# Patient Record
Sex: Male | Born: 1955
Health system: Southern US, Community
[De-identification: ages and names within clinical notes are randomized; demographics above are authoritative.]

## PROBLEM LIST (undated history)

## (undated) DIAGNOSIS — N400 Enlarged prostate without lower urinary tract symptoms: Secondary | ICD-10-CM

## (undated) DIAGNOSIS — E785 Hyperlipidemia, unspecified: Secondary | ICD-10-CM

## (undated) DIAGNOSIS — M1990 Unspecified osteoarthritis, unspecified site: Secondary | ICD-10-CM

## (undated) DIAGNOSIS — I10 Essential (primary) hypertension: Secondary | ICD-10-CM

## (undated) DIAGNOSIS — N189 Chronic kidney disease, unspecified: Secondary | ICD-10-CM

## (undated) DIAGNOSIS — M109 Gout, unspecified: Secondary | ICD-10-CM

## (undated) HISTORY — PX: OTHER SURGICAL HISTORY: SHX169

## (undated) HISTORY — DX: Unspecified osteoarthritis, unspecified site: M19.90

## (undated) HISTORY — PX: TOOTH EXTRACTION: SUR596

## (undated) HISTORY — DX: Benign prostatic hyperplasia without lower urinary tract symptoms: N40.0

---
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
## 2013-11-07 ENCOUNTER — Other Ambulatory Visit (HOSPITAL_COMMUNITY): Payer: Self-pay | Admitting: Respiratory Therapy

## 2013-11-07 DIAGNOSIS — G4730 Sleep apnea, unspecified: Secondary | ICD-10-CM

## 2013-11-10 ENCOUNTER — Ambulatory Visit: Payer: 59 | Attending: Neurology | Admitting: Sleep Medicine

## 2013-11-10 VITALS — Ht 70.0 in | Wt 265.0 lb

## 2013-11-10 DIAGNOSIS — R0683 Snoring: Secondary | ICD-10-CM | POA: Diagnosis present

## 2013-11-10 DIAGNOSIS — G4733 Obstructive sleep apnea (adult) (pediatric): Secondary | ICD-10-CM | POA: Insufficient documentation

## 2013-11-10 DIAGNOSIS — R5383 Other fatigue: Secondary | ICD-10-CM | POA: Diagnosis present

## 2013-11-10 DIAGNOSIS — G4710 Hypersomnia, unspecified: Secondary | ICD-10-CM | POA: Diagnosis present

## 2013-11-10 DIAGNOSIS — G4730 Sleep apnea, unspecified: Secondary | ICD-10-CM

## 2013-11-10 DIAGNOSIS — Z6838 Body mass index (BMI) 38.0-38.9, adult: Secondary | ICD-10-CM | POA: Diagnosis not present

## 2013-11-16 NOTE — Sleep Study (Signed)
  [place] A. Merlene Laughter, MD     [email or web address]        NOCTURNAL HOME SLEEP STUDY   LOCATION: SLEEP LAB FACILITY: [facility]   PHYSICIAN: Merrill Villarruel A. Merlene Laughter, M.D.   DATE OF STUDY: 11/10/2013.   REFERRING PHYSICIAN: Karrigan Messamore.   INDICATIONS: The patient is 58 year old presents with fatigue, snoring and hypersomnia.  MEDICATIONS:  Prior to Admission medications   Not on File      EPWORTH SLEEPINESS SCALE: 12.   BMI: 38.   ANALYSIS:  Channels recorded the following: Airflow with a nasal pressure transducer, oxygen saturation, heart rate, thoracic and abdominal respiratory movements and body position. The AHI is 52. The average oxygen saturation is 92. The lowest oxygen saturation is 72. Average heart rate is 70.  IMPRESSION:  1. Severe obstructive sleep apnea syndrome. A formal CPAP titration recording is suggested.  Thanks for this referral.  Mahari Vankirk A. Merlene Laughter, M.D. Diplomat, Tax adviser of Sleep Medicine.

## 2014-07-17 ENCOUNTER — Other Ambulatory Visit (HOSPITAL_COMMUNITY): Payer: Self-pay | Admitting: Nephrology

## 2014-07-17 DIAGNOSIS — N183 Chronic kidney disease, stage 3 unspecified: Secondary | ICD-10-CM

## 2014-08-10 ENCOUNTER — Ambulatory Visit (HOSPITAL_COMMUNITY)
Admission: RE | Admit: 2014-08-10 | Discharge: 2014-08-10 | Disposition: A | Discharge status: Home / Self Care | Payer: 59 | Source: Ambulatory Visit | Attending: Nephrology | Admitting: Nephrology

## 2014-08-10 DIAGNOSIS — N183 Chronic kidney disease, stage 3 unspecified: Secondary | ICD-10-CM

## 2015-11-25 DIAGNOSIS — L309 Dermatitis, unspecified: Secondary | ICD-10-CM | POA: Diagnosis not present

## 2015-11-25 DIAGNOSIS — Z1389 Encounter for screening for other disorder: Secondary | ICD-10-CM | POA: Diagnosis not present

## 2015-11-25 DIAGNOSIS — M1000 Idiopathic gout, unspecified site: Secondary | ICD-10-CM | POA: Diagnosis not present

## 2015-11-25 DIAGNOSIS — Z6838 Body mass index (BMI) 38.0-38.9, adult: Secondary | ICD-10-CM | POA: Diagnosis not present

## 2015-11-25 DIAGNOSIS — N183 Chronic kidney disease, stage 3 (moderate): Secondary | ICD-10-CM | POA: Diagnosis not present

## 2015-11-27 DIAGNOSIS — N183 Chronic kidney disease, stage 3 (moderate): Secondary | ICD-10-CM | POA: Diagnosis not present

## 2015-11-27 DIAGNOSIS — M1000 Idiopathic gout, unspecified site: Secondary | ICD-10-CM | POA: Diagnosis not present

## 2015-11-27 DIAGNOSIS — Z1389 Encounter for screening for other disorder: Secondary | ICD-10-CM | POA: Diagnosis not present

## 2015-11-27 DIAGNOSIS — Z6839 Body mass index (BMI) 39.0-39.9, adult: Secondary | ICD-10-CM | POA: Diagnosis not present

## 2015-11-27 DIAGNOSIS — I10 Essential (primary) hypertension: Secondary | ICD-10-CM | POA: Diagnosis not present

## 2015-12-16 DIAGNOSIS — Z6838 Body mass index (BMI) 38.0-38.9, adult: Secondary | ICD-10-CM | POA: Diagnosis not present

## 2015-12-16 DIAGNOSIS — N183 Chronic kidney disease, stage 3 (moderate): Secondary | ICD-10-CM | POA: Diagnosis not present

## 2015-12-16 DIAGNOSIS — M1A00X0 Idiopathic chronic gout, unspecified site, without tophus (tophi): Secondary | ICD-10-CM | POA: Diagnosis not present

## 2015-12-16 DIAGNOSIS — E782 Mixed hyperlipidemia: Secondary | ICD-10-CM | POA: Diagnosis not present

## 2015-12-16 DIAGNOSIS — M7711 Lateral epicondylitis, right elbow: Secondary | ICD-10-CM | POA: Diagnosis not present

## 2015-12-17 ENCOUNTER — Ambulatory Visit (HOSPITAL_COMMUNITY)
Admission: RE | Admit: 2015-12-17 | Discharge: 2015-12-17 | Disposition: A | Discharge status: Home / Self Care | Payer: 59 | Source: Ambulatory Visit | Attending: Internal Medicine | Admitting: Internal Medicine

## 2015-12-17 ENCOUNTER — Other Ambulatory Visit (HOSPITAL_COMMUNITY): Payer: Self-pay | Admitting: Internal Medicine

## 2015-12-17 DIAGNOSIS — R809 Proteinuria, unspecified: Secondary | ICD-10-CM | POA: Diagnosis not present

## 2015-12-17 DIAGNOSIS — M25521 Pain in right elbow: Secondary | ICD-10-CM | POA: Insufficient documentation

## 2015-12-17 DIAGNOSIS — Z79899 Other long term (current) drug therapy: Secondary | ICD-10-CM | POA: Diagnosis not present

## 2015-12-17 DIAGNOSIS — E559 Vitamin D deficiency, unspecified: Secondary | ICD-10-CM | POA: Diagnosis not present

## 2015-12-17 DIAGNOSIS — N183 Chronic kidney disease, stage 3 (moderate): Secondary | ICD-10-CM | POA: Diagnosis not present

## 2015-12-17 DIAGNOSIS — I10 Essential (primary) hypertension: Secondary | ICD-10-CM | POA: Diagnosis not present

## 2015-12-17 DIAGNOSIS — D509 Iron deficiency anemia, unspecified: Secondary | ICD-10-CM | POA: Diagnosis not present

## 2015-12-17 DIAGNOSIS — R0602 Shortness of breath: Secondary | ICD-10-CM

## 2015-12-24 DIAGNOSIS — M109 Gout, unspecified: Secondary | ICD-10-CM | POA: Diagnosis not present

## 2015-12-24 DIAGNOSIS — M79672 Pain in left foot: Secondary | ICD-10-CM | POA: Diagnosis not present

## 2015-12-24 DIAGNOSIS — M25461 Effusion, right knee: Secondary | ICD-10-CM | POA: Diagnosis not present

## 2015-12-24 DIAGNOSIS — M79671 Pain in right foot: Secondary | ICD-10-CM | POA: Diagnosis not present

## 2016-01-27 DIAGNOSIS — M109 Gout, unspecified: Secondary | ICD-10-CM | POA: Diagnosis not present

## 2016-01-27 DIAGNOSIS — Z79899 Other long term (current) drug therapy: Secondary | ICD-10-CM | POA: Diagnosis not present

## 2016-01-28 DIAGNOSIS — Z79899 Other long term (current) drug therapy: Secondary | ICD-10-CM | POA: Diagnosis not present

## 2016-01-28 DIAGNOSIS — M25562 Pain in left knee: Secondary | ICD-10-CM | POA: Diagnosis not present

## 2016-01-28 DIAGNOSIS — M109 Gout, unspecified: Secondary | ICD-10-CM | POA: Diagnosis not present

## 2016-01-28 DIAGNOSIS — N183 Chronic kidney disease, stage 3 (moderate): Secondary | ICD-10-CM | POA: Diagnosis not present

## 2016-03-04 DIAGNOSIS — M109 Gout, unspecified: Secondary | ICD-10-CM | POA: Diagnosis not present

## 2016-03-04 DIAGNOSIS — Z79899 Other long term (current) drug therapy: Secondary | ICD-10-CM | POA: Diagnosis not present

## 2016-03-04 DIAGNOSIS — N183 Chronic kidney disease, stage 3 (moderate): Secondary | ICD-10-CM | POA: Diagnosis not present

## 2016-03-05 DIAGNOSIS — M25461 Effusion, right knee: Secondary | ICD-10-CM | POA: Diagnosis not present

## 2016-03-05 DIAGNOSIS — M109 Gout, unspecified: Secondary | ICD-10-CM | POA: Diagnosis not present

## 2016-03-05 DIAGNOSIS — N183 Chronic kidney disease, stage 3 (moderate): Secondary | ICD-10-CM | POA: Diagnosis not present

## 2016-03-05 DIAGNOSIS — R799 Abnormal finding of blood chemistry, unspecified: Secondary | ICD-10-CM | POA: Diagnosis not present

## 2016-04-30 DIAGNOSIS — M25532 Pain in left wrist: Secondary | ICD-10-CM | POA: Diagnosis not present

## 2016-04-30 DIAGNOSIS — M109 Gout, unspecified: Secondary | ICD-10-CM | POA: Diagnosis not present

## 2016-04-30 DIAGNOSIS — M25461 Effusion, right knee: Secondary | ICD-10-CM | POA: Diagnosis not present

## 2016-04-30 DIAGNOSIS — Z79899 Other long term (current) drug therapy: Secondary | ICD-10-CM | POA: Diagnosis not present

## 2016-05-26 DIAGNOSIS — Z79899 Other long term (current) drug therapy: Secondary | ICD-10-CM | POA: Diagnosis not present

## 2016-05-26 DIAGNOSIS — N183 Chronic kidney disease, stage 3 (moderate): Secondary | ICD-10-CM | POA: Diagnosis not present

## 2016-05-26 DIAGNOSIS — D509 Iron deficiency anemia, unspecified: Secondary | ICD-10-CM | POA: Diagnosis not present

## 2016-05-26 DIAGNOSIS — I10 Essential (primary) hypertension: Secondary | ICD-10-CM | POA: Diagnosis not present

## 2016-05-26 DIAGNOSIS — E559 Vitamin D deficiency, unspecified: Secondary | ICD-10-CM | POA: Diagnosis not present

## 2016-06-02 DIAGNOSIS — E870 Hyperosmolality and hypernatremia: Secondary | ICD-10-CM | POA: Diagnosis not present

## 2016-06-02 DIAGNOSIS — N183 Chronic kidney disease, stage 3 (moderate): Secondary | ICD-10-CM | POA: Diagnosis not present

## 2016-06-02 DIAGNOSIS — I10 Essential (primary) hypertension: Secondary | ICD-10-CM | POA: Diagnosis not present

## 2016-06-02 DIAGNOSIS — R809 Proteinuria, unspecified: Secondary | ICD-10-CM | POA: Diagnosis not present

## 2016-11-03 DIAGNOSIS — Z681 Body mass index (BMI) 19 or less, adult: Secondary | ICD-10-CM | POA: Diagnosis not present

## 2016-11-03 DIAGNOSIS — M25572 Pain in left ankle and joints of left foot: Secondary | ICD-10-CM | POA: Diagnosis not present

## 2016-11-03 DIAGNOSIS — M25562 Pain in left knee: Secondary | ICD-10-CM | POA: Diagnosis not present

## 2016-11-03 DIAGNOSIS — Z1389 Encounter for screening for other disorder: Secondary | ICD-10-CM | POA: Diagnosis not present

## 2016-11-03 DIAGNOSIS — M25532 Pain in left wrist: Secondary | ICD-10-CM | POA: Diagnosis not present

## 2016-11-03 DIAGNOSIS — M1000 Idiopathic gout, unspecified site: Secondary | ICD-10-CM | POA: Diagnosis not present

## 2016-11-17 ENCOUNTER — Observation Stay (HOSPITAL_COMMUNITY)
Admission: EM | Admit: 2016-11-17 | Discharge: 2016-11-18 | Disposition: A | Discharge status: Home / Self Care | Payer: BLUE CROSS/BLUE SHIELD | Attending: Internal Medicine | Admitting: Internal Medicine

## 2016-11-17 ENCOUNTER — Emergency Department (HOSPITAL_COMMUNITY): Payer: BLUE CROSS/BLUE SHIELD

## 2016-11-17 ENCOUNTER — Observation Stay (HOSPITAL_BASED_OUTPATIENT_CLINIC_OR_DEPARTMENT_OTHER): Payer: BLUE CROSS/BLUE SHIELD

## 2016-11-17 ENCOUNTER — Encounter (HOSPITAL_COMMUNITY): Payer: Self-pay | Admitting: Emergency Medicine

## 2016-11-17 DIAGNOSIS — I959 Hypotension, unspecified: Secondary | ICD-10-CM | POA: Insufficient documentation

## 2016-11-17 DIAGNOSIS — M10471 Other secondary gout, right ankle and foot: Secondary | ICD-10-CM | POA: Diagnosis not present

## 2016-11-17 DIAGNOSIS — R739 Hyperglycemia, unspecified: Secondary | ICD-10-CM | POA: Diagnosis not present

## 2016-11-17 DIAGNOSIS — N289 Disorder of kidney and ureter, unspecified: Secondary | ICD-10-CM | POA: Insufficient documentation

## 2016-11-17 DIAGNOSIS — N183 Chronic kidney disease, stage 3 unspecified: Secondary | ICD-10-CM

## 2016-11-17 DIAGNOSIS — R55 Syncope and collapse: Principal | ICD-10-CM | POA: Insufficient documentation

## 2016-11-17 DIAGNOSIS — E871 Hypo-osmolality and hyponatremia: Secondary | ICD-10-CM

## 2016-11-17 DIAGNOSIS — R0902 Hypoxemia: Secondary | ICD-10-CM | POA: Diagnosis not present

## 2016-11-17 DIAGNOSIS — M10071 Idiopathic gout, right ankle and foot: Secondary | ICD-10-CM | POA: Diagnosis not present

## 2016-11-17 DIAGNOSIS — M109 Gout, unspecified: Secondary | ICD-10-CM | POA: Diagnosis not present

## 2016-11-17 DIAGNOSIS — Z79899 Other long term (current) drug therapy: Secondary | ICD-10-CM | POA: Diagnosis not present

## 2016-11-17 HISTORY — DX: Gout, unspecified: M10.9

## 2016-11-17 LAB — URINALYSIS, ROUTINE W REFLEX MICROSCOPIC
Bilirubin Urine: NEGATIVE
Glucose, UA: NEGATIVE mg/dL
Ketones, ur: NEGATIVE mg/dL
LEUKOCYTES UA: NEGATIVE
Nitrite: NEGATIVE
Protein, ur: 30 mg/dL — AB
SPECIFIC GRAVITY, URINE: 1.011 (ref 1.005–1.030)
pH: 5 (ref 5.0–8.0)

## 2016-11-17 LAB — COMPREHENSIVE METABOLIC PANEL
ALBUMIN: 3.4 g/dL — AB (ref 3.5–5.0)
ALT: 17 U/L (ref 17–63)
ANION GAP: 11 (ref 5–15)
AST: 14 U/L — AB (ref 15–41)
Alkaline Phosphatase: 81 U/L (ref 38–126)
BILIRUBIN TOTAL: 1.8 mg/dL — AB (ref 0.3–1.2)
BUN: 20 mg/dL (ref 6–20)
CHLORIDE: 98 mmol/L — AB (ref 101–111)
CO2: 23 mmol/L (ref 22–32)
Calcium: 8.8 mg/dL — ABNORMAL LOW (ref 8.9–10.3)
Creatinine, Ser: 1.94 mg/dL — ABNORMAL HIGH (ref 0.61–1.24)
GFR calc Af Amer: 41 mL/min — ABNORMAL LOW (ref >60)
GFR calc non Af Amer: 35 mL/min — ABNORMAL LOW (ref >60)
GLUCOSE: 169 mg/dL — AB (ref 65–99)
POTASSIUM: 4.1 mmol/L (ref 3.5–5.1)
SODIUM: 132 mmol/L — AB (ref 135–145)
Total Protein: 7.3 g/dL (ref 6.5–8.1)

## 2016-11-17 LAB — ECHOCARDIOGRAM COMPLETE
HEIGHTINCHES: 70 in
WEIGHTICAEL: 4480 [oz_av]

## 2016-11-17 LAB — CBC WITH DIFFERENTIAL/PLATELET
BASOS ABS: 0 10*3/uL (ref 0.0–0.1)
Basophils Relative: 0 %
EOS PCT: 0 %
Eosinophils Absolute: 0 10*3/uL (ref 0.0–0.7)
HCT: 41.3 % (ref 39.0–52.0)
HEMOGLOBIN: 13.3 g/dL (ref 13.0–17.0)
LYMPHS ABS: 2.7 10*3/uL (ref 0.7–4.0)
LYMPHS PCT: 13 %
MCH: 28.4 pg (ref 26.0–34.0)
MCHC: 32.2 g/dL (ref 30.0–36.0)
MCV: 88.2 fL (ref 78.0–100.0)
Monocytes Absolute: 2.9 10*3/uL — ABNORMAL HIGH (ref 0.1–1.0)
Monocytes Relative: 14 %
NEUTROS ABS: 15.4 10*3/uL — AB (ref 1.7–7.7)
NEUTROS PCT: 73 %
PLATELETS: 267 10*3/uL (ref 150–400)
RBC: 4.68 MIL/uL (ref 4.22–5.81)
RDW: 14.4 % (ref 11.5–15.5)
WBC: 21 10*3/uL — AB (ref 4.0–10.5)

## 2016-11-17 LAB — HEMOGLOBIN A1C
Hgb A1c MFr Bld: 5.8 % — ABNORMAL HIGH (ref 4.8–5.6)
Mean Plasma Glucose: 119.76 mg/dL

## 2016-11-17 LAB — LACTIC ACID, PLASMA
LACTIC ACID, VENOUS: 1.4 mmol/L (ref 0.5–1.9)
Lactic Acid, Venous: 1.5 mmol/L (ref 0.5–1.9)

## 2016-11-17 LAB — URIC ACID: Uric Acid, Serum: 5.5 mg/dL (ref 4.4–7.6)

## 2016-11-17 LAB — TROPONIN I: Troponin I: <0.03 ng/mL (ref <0.03)

## 2016-11-17 LAB — CK: CK TOTAL: 46 U/L — AB (ref 49–397)

## 2016-11-17 MED ORDER — ENOXAPARIN SODIUM 40 MG/0.4ML ~~LOC~~ SOLN
40.0000 mg | SUBCUTANEOUS | Status: DC
  Administered 2016-11-17: 40 mg via SUBCUTANEOUS
  Filled 2016-11-17: qty 0.4

## 2016-11-17 MED ORDER — ONDANSETRON HCL 4 MG PO TABS: 4.0000 mg | ORAL_TABLET | Freq: Four times a day (QID) | ORAL | Status: DC | PRN

## 2016-11-17 MED ORDER — ENSURE ENLIVE PO LIQD
237.0000 mL | Freq: Two times a day (BID) | ORAL | Status: DC
  Administered 2016-11-18 (×2): 237 mL via ORAL

## 2016-11-17 MED ORDER — SODIUM CHLORIDE 0.9 % IV BOLUS (SEPSIS)
1000.0000 mL | Freq: Once | INTRAVENOUS | Status: AC
  Administered 2016-11-17: 1000 mL via INTRAVENOUS

## 2016-11-17 MED ORDER — TRAMADOL HCL 50 MG PO TABS
50.0000 mg | ORAL_TABLET | Freq: Four times a day (QID) | ORAL | Status: DC | PRN
  Administered 2016-11-17 (×2): 50 mg via ORAL
  Filled 2016-11-17 (×2): qty 1

## 2016-11-17 MED ORDER — POTASSIUM CHLORIDE IN NACL 20-0.9 MEQ/L-% IV SOLN
INTRAVENOUS | Status: AC
  Administered 2016-11-17 – 2016-11-18 (×2): via INTRAVENOUS

## 2016-11-17 MED ORDER — ACETAMINOPHEN 325 MG PO TABS: 650.0000 mg | ORAL_TABLET | Freq: Four times a day (QID) | ORAL | Status: DC | PRN

## 2016-11-17 MED ORDER — ONDANSETRON HCL 4 MG/2ML IJ SOLN: 4.0000 mg | Freq: Four times a day (QID) | INTRAMUSCULAR | Status: DC | PRN

## 2016-11-17 MED ORDER — ALLOPURINOL 300 MG PO TABS
300.0000 mg | ORAL_TABLET | Freq: Every day | ORAL | Status: DC
  Administered 2016-11-17 – 2016-11-18 (×2): 300 mg via ORAL
  Filled 2016-11-17 (×2): qty 1

## 2016-11-17 MED ORDER — ACETAMINOPHEN 650 MG RE SUPP: 650.0000 mg | Freq: Four times a day (QID) | RECTAL | Status: DC | PRN

## 2016-11-17 MED ORDER — METHYLPREDNISOLONE SODIUM SUCC 125 MG IJ SOLR
80.0000 mg | Freq: Every day | INTRAMUSCULAR | Status: DC
  Administered 2016-11-17 – 2016-11-18 (×2): 80 mg via INTRAVENOUS
  Filled 2016-11-17 (×2): qty 2

## 2016-11-17 MED ORDER — PERFLUTREN LIPID MICROSPHERE
1.0000 mL | INTRAVENOUS | Status: AC | PRN
  Administered 2016-11-17: 2 mL via INTRAVENOUS
  Filled 2016-11-17: qty 10

## 2016-11-17 MED ORDER — SODIUM CHLORIDE 0.9 % IV SOLN
INTRAVENOUS | Status: DC
  Administered 2016-11-17: 11:00:00 via INTRAVENOUS

## 2016-11-17 NOTE — Progress Notes (Signed)
*  PRELIMINARY RESULTS* Echocardiogram 2D Echocardiogram with definity has been performed.  Dale Green 11/17/2016, 4:35 PM

## 2016-11-17 NOTE — ED Notes (Signed)
Report given to Jessica, RN on 300 

## 2016-11-17 NOTE — ED Notes (Signed)
Denies any chest pain of dizziness at this time.  History gout to ankles, left knee and left wrist.  Currently not on any BP medications.  Was headed to Drs appointment regarding gout.  After getting to car on crutches, got dizzy and nausea and when he came to, wife was calling 911.

## 2016-11-17 NOTE — ED Triage Notes (Signed)
Pt reports having flare of gout since last week with pain all over.  Being tx with colchicine and prednisone.  This morning had a near syncopal episode while going to the car headed to his doctor's appointment.  Pt denies dizziness at this time.  Pt was hypotensive on ems arrival.  No interventions done.

## 2016-11-17 NOTE — H&P (Signed)
History and Physical  Dale Green PXT:[id number] DOB: 07-05-1955 DOA: 11/17/2016   PCP: [facility]   Patient coming from: Home  Chief Complaint: syncope  HPI:  Dale Green is a 61 y.o. male with medical history of gouty arthritis presenting with a syncopal episode.  Notably, the patient has had an exacerbation of his cardia arthritis in the past 1-2 weeks for which he has been taking prednisone and allopurinol.  The patient was trying to get into his car to run some errands today.  After he was able to transfer from his crutches into the passenger seat, his wife noted that the patient lost consciousness and slumped backward with his eyes "rolled into the back of his head"according to the patient's wife.  She activated EMS.  The next thing the patient remembers was EMS at the scene.  The patient's wife states that he lost consciousness for approximately 5 minutes.  When he initially woke up, the patient seemed a little confused, but when the EMS arrived, he was alert and oriented again.  The patient denied any prodromal symptoms.  He has not had any dizziness, chest discomfort, shortness of breath, nausea, vomiting.  He has not started any new medications.  There was no tonic-clonic activity.  The patient did not bite his tongue nor was he incontinent of bowel or bladder.  Because of his syncope, the patient was brought to emergency department for further evaluation. Regarding his gouty arthritis, the patient has been suffering with this for period of time.  He went to see his primary care provider who started him on what sounds to be a Medrol Dosepak which the patient started on November 10, 2016.  He finished the steroid pack on November 15, 2016.  He started his allopurinol on the same day he finished the prednisone.  He stated that his pain had actually improved somewhat near the end of his steroid taper, but has worsened again since starting allopurinol.  He  previously followed rheumatology, Dr. Charlestine Night, who had the patient on a 2-month taper of his prednisone which she finished in the beginning of September 2018.  In the emergency department, the patient's blood pressure was initially soft with systolic blood pressure of 92.  The patient actually was hypotensive when EMS arrived.  His blood pressure improved with 1 L bolus NS.  Sodium was 132 with a serum creatinine 1.94.  Remainder of BMP was unremarkable.  WBC was 21.0.  Hepatic enzymes were unremarkable except for bilirubin of 1.8.  Chest x-ray was negative.  EKG showed sinus rhythm with nonspecific T wave change.  Urinalysis was negative for pyuria.  Lactic acid was 1.4.  Assessment/Plan: Syncope -Suspect a vagal reaction secondary to the patient's pain from his gouty exacerbation -Orthostatics negative in the emergency department -Monitor on telemetry -Echocardiogram -Low clinical suspicion seizure, but will order EEG  Acute gouty exacerbation -Uric acid -Start Solu-Medrol 80 mg IV daily -As the patient has not had any prolonged periods without gouty symptoms in the past 3 months, will start concomitant allopurinol with steroids -hold colchicine due to CKD -continue home dose tramadol  CKD stage III -No previous baseline, but Patient relates history of CKD stage III -Suspect baseline near 1.9 -A.m. BMP  Hyponatremia -Trial of IV normal saline times 24 hours -A.m. BMP  Leukocytosis -Likely secondary to his recent steroid -Urinalysis negative -Lactic acid 1.5 -Chest x-ray negative -Obtain blood cultures  Hyperglycemia -Check hemoglobin A1c -The patient has been  off steroids for 2 days now and remains hyperglycemic  Hyperbilirubinemia -likely Gilbert's -fractionate bili       Past Medical History:  Diagnosis Date  . Gout    History reviewed. No pertinent surgical history. Social History:  reports that he has never smoked. He does not have any smokeless tobacco  history on file. He reports that he does not drink alcohol or use drugs.   History reviewed. No pertinent family history.   No Known Allergies   Prior to Admission medications   Medication Sig Start Date End Date Taking? Authorizing Provider  allopurinol (ZYLOPRIM) 300 MG tablet Take 300 mg by mouth daily. 11/03/16  Yes [provider]  colchicine 0.6 MG tablet Take 0.6 mg by mouth 2 (two) times daily as needed. Gout flairups 11/03/16  Yes [provider]  traMADol (ULTRAM) 50 MG tablet Take 50 mg by mouth 3 (three) times daily. 11/03/16  Yes [provider]    Review of Systems:  Constitutional:  No weight loss, night sweats, Fevers, chills, fatigue.  Head&Eyes: No headache.  No vision loss.  No eye pain or scotoma ENT:  No Difficulty swallowing,Tooth/dental problems,Sore throat,  No ear ache, post nasal drip,  Cardio-vascular:  No chest pain, Orthopnea, PND, swelling in lower extremities,  dizziness, palpitations  GI:  No  abdominal pain, nausea, vomiting, diarrhea, loss of appetite, hematochezia, melena, heartburn, indigestion, Resp:  No shortness of breath with exertion or at rest. No cough. No coughing up of blood .No wheezing.No chest wall deformity  Skin:  no rash or lesions.  GU:  no dysuria, change in color of urine, no urgency or frequency. No flank pain.  Musculoskeletal:  Complains of bilateral ankle pain as well as his left knee pain related to his gouty arthritis. Psych:  No change in mood or affect. No depression or anxiety. Neurologic: No headache, no dysesthesia, no focal weakness, no vision loss. No syncope  Physical Exam: Vitals:   11/17/16 1154 11/17/16 1200 11/17/16 1215 11/17/16 1230  BP: 124/70 119/71 136/70 133/90  Pulse: 95 93 98   Resp: 18     Temp:      TempSrc:      SpO2: 99% 99% 100%   Weight:      Height:       General:  A&O x 3, NAD, nontoxic, pleasant/cooperative Head/Eye: No conjunctival hemorrhage, no  icterus, [place]/AT, No nystagmus ENT:  No icterus,  No thrush, good dentition, no pharyngeal exudate Neck:  No masses, no lymphadenpathy, no bruits CV:  RRR, no rub, no gallop, no S3 Lung:  CTAB, good air movement, no wheeze, no rhonchi Abdomen: soft/NT, +BS, nondistended, no peritoneal signs Ext: No cyanosis, No rashes, No petechiae, No lymphangitis, No edema Neuro: CNII-XII intact, strength 4/5 in bilateral upper and lower extremities, no dysmetria  Labs on Admission:  Basic Metabolic Panel:  Recent Labs Lab 11/17/16 1010  NA 132*  K 4.1  CL 98*  CO2 23  GLUCOSE 169*  BUN 20  CREATININE 1.94*  CALCIUM 8.8*   Liver Function Tests:  Recent Labs Lab 11/17/16 1010  AST 14*  ALT 17  ALKPHOS 81  BILITOT 1.8*  PROT 7.3  ALBUMIN 3.4*   No results for input(s): LIPASE, AMYLASE in the last 168 hours. No results for input(s): AMMONIA in the last 168 hours. CBC:  Recent Labs Lab 11/17/16 1010  WBC 21.0*  NEUTROABS 15.4*  HGB 13.3  HCT 41.3  MCV 88.2  PLT 267  Coagulation Profile: No results for input(s): INR, PROTIME in the last 168 hours. Cardiac Enzymes:  Recent Labs Lab 11/17/16 1010  TROPONINI <0.03   BNP: Invalid input(s): POCBNP CBG: No results for input(s): GLUCAP in the last 168 hours. Urine analysis:    Component Value Date/Time   COLORURINE YELLOW 11/17/2016 1011   APPEARANCEUR HAZY (A) 11/17/2016 1011   LABSPEC 1.011 11/17/2016 1011   PHURINE 5.0 11/17/2016 1011   GLUCOSEU NEGATIVE 11/17/2016 1011   HGBUR SMALL (A) 11/17/2016 1011   BILIRUBINUR NEGATIVE 11/17/2016 [place] 11/17/2016 1011   PROTEINUR 30 (A) 11/17/2016 1011   NITRITE NEGATIVE 11/17/2016 [place] 11/17/2016 1011   Sepsis Labs: @LABRCNTIP (procalcitonin:4,lacticidven:4) )No results found for this or any previous visit (from the past 240 hour(s)).   Radiological Exams on Admission: Dg Chest 2 View  Result Date: 11/17/2016 CLINICAL  DATA:  Syncopal episode EXAM: CHEST  2 VIEW COMPARISON:  None. FINDINGS: The heart size and mediastinal contours are within normal limits. Both lungs are clear. The visualized skeletal structures are unremarkable. IMPRESSION: No active cardiopulmonary disease. Electronically Signed   By: Inez Catalina M.D.   On: 11/17/2016 11:24   Ct Head Wo Contrast  Result Date: 11/17/2016 CLINICAL DATA:  Near syncope today EXAM: CT HEAD WITHOUT CONTRAST TECHNIQUE: Contiguous axial images were obtained from the base of the skull through the vertex without intravenous contrast. COMPARISON:  None. FINDINGS: Brain: No evidence of malformation, atrophy, old or acute small or large vessel infarction, mass lesion, hemorrhage, hydrocephalus or extra-axial collection. No evidence of pituitary lesion. Vascular: No vascular calcification.  No hyperdense vessels. Skull: Normal.  No fracture or focal bone lesion. Sinuses/Orbits: Visualized sinuses are clear. No fluid in the middle ears or mastoids. Visualized orbits are normal. Other: None significant IMPRESSION: Normal head CT. Electronically Signed   By: Nelson Chimes M.D.   On: 11/17/2016 11:09    EKG: Independently reviewed.  Sinus rhythm, nonspecific T wave change    Time spent:60 minutes Code Status:   FULL Family Communication:  Spouse updated at bedside Disposition Plan: expect 1 day hospitalization Consults called: none DVT Prophylaxis: [place] Lovenox  Keimya Briddell, DO  [facility] Pager [phone]  If 7PM-7AM, please contact night-coverage [email or web address] Password [email or web address] 11/17/2016, 1:59 PM

## 2016-11-17 NOTE — ED Provider Notes (Signed)
[facility] EMERGENCY DEPARTMENT Provider Note   CSN: [id number] Arrival date & time: 11/17/16  6712     History   Chief Complaint Chief Complaint  Patient presents with  . Near Syncope  . Hypotension    HPI Dale Green is a 61 y.o. male.  HPI  Pt was seen at 1010. Per pt and his wife, c/o sudden onset and resolution of one episode of syncope that occurred PTA. Pt states he walked to the car on crutches due to acute flair of his chronic gout in his feet, when he "got in the car and passed out." Pt's wife states pt was recently started on allopurinol and prednisone for acute gout flair. EMS states pt was "hypotensive" on their arrival to scene. Pt denies any other new meds or changes in usual meds. Denies prodromal symptoms before syncopal episode. Denies CP/palpitations, no SOB/cough, no abd pain, no N/V/D, no fevers, no rash, no focal motor weakness, no tingling/numbness in extremities.   Past Medical History:  Diagnosis Date  . Gout     There are no active problems to display for this patient.   History reviewed. No pertinent surgical history.     Home Medications    Prior to Admission medications   Medication Sig Start Date End Date Taking? Authorizing Provider  allopurinol (ZYLOPRIM) 300 MG tablet Take 300 mg by mouth daily. 11/03/16   [provider]  colchicine 0.6 MG tablet Take 0.6 mg by mouth 2 (two) times daily as needed. Gout flairups 11/03/16   [provider]  traMADol (ULTRAM) 50 MG tablet Take 50 mg by mouth 3 (three) times daily. 11/03/16   [provider]    Family History History reviewed. No pertinent family history.  Social History Social History  Substance Use Topics  . Smoking status: Never Smoker  . Smokeless tobacco: Not on file  . Alcohol use No     Allergies   Patient has no known allergies.   Review of Systems Review of Systems ROS: Statement: All systems negative except as marked or noted in the  HPI; Constitutional: Negative for fever and chills. ; ; Eyes: Negative for eye pain, redness and discharge. ; ; ENMT: Negative for ear pain, hoarseness, nasal congestion, sinus pressure and sore throat. ; ; Cardiovascular: Negative for chest pain, palpitations, diaphoresis, dyspnea and peripheral edema. ; ; Respiratory: Negative for cough, wheezing and stridor. ; ; Gastrointestinal: Negative for nausea, vomiting, diarrhea, abdominal pain, blood in stool, hematemesis, jaundice and rectal bleeding. . ; ; Genitourinary: Negative for dysuria, flank pain and hematuria. ; ; Musculoskeletal: Negative for back pain and neck pain. Negative for swelling and trauma.; ; Skin: Negative for pruritus, rash, abrasions, blisters, bruising and skin lesion.; ; Neuro: Negative for headache, lightheadedness and neck stiffness. Negative for extremity weakness, paresthesias, involuntary movement, seizure and +syncope.       Physical Exam Updated Vital Signs BP 106/67 (BP Location: Left Arm)   Pulse 95   Temp 98.1 F (36.7 C) (Oral)   Resp 15   Ht 5\' 10"  (1.778 m)   Wt 127 kg (280 lb)   SpO2 100%   BMI 40.18 kg/m   12:17 Orthostatic Vital Signs JB  Orthostatic Lying   BP- Lying: 139/65  Pulse- Lying: 93      Orthostatic Sitting  BP- Sitting: 130/71  Pulse- Sitting: 108      Orthostatic Standing at 0 minutes  BP- Standing at 0 minutes:  (unable to stand)  Patient Vitals for the past 24 hrs:  BP Temp Temp src Pulse Resp SpO2 Height Weight  11/17/16 1230 133/90 - - - - - - -  11/17/16 1215 136/70 - - 98 - 100 % - -  11/17/16 1200 119/71 - - 93 - 99 % - -  11/17/16 1154 124/70 - - 95 18 99 % - -  11/17/16 1145 124/70 - - 95 - 98 % - -  11/17/16 1002 106/67 - - 95 15 100 % - -  11/17/16 1000 92/81 - - 96 18 100 % - -  11/17/16 0929 100/70 98.1 F (36.7 C) Oral 95 16 100 % - -  11/17/16 0925 - - - - - - 5\' 10"  (1.778 m) 127 kg (280 lb)     Physical Exam 1015: Physical examination:  Nursing notes  reviewed; Vital signs and O2 SAT reviewed;  Constitutional: Well developed, Well nourished, Well hydrated, In no acute distress; Head:  Normocephalic, atraumatic; Eyes: EOMI, PERRL, No scleral icterus; ENMT: Mouth and pharynx normal, Mucous membranes moist; Neck: Supple, Full range of motion, No lymphadenopathy; Cardiovascular: Regular rate and rhythm, No gallop; Respiratory: Breath sounds clear & equal bilaterally, No wheezes.  Speaking full sentences with ease, Normal respiratory effort/excursion; Chest: Nontender, Movement normal; Abdomen: Soft, Nontender, Nondistended, Normal bowel sounds; Genitourinary: No CVA tenderness; Extremities: Pulses normal, No tenderness, No edema, No calf edema or asymmetry. No rash, warmth, swelling to bilat LE ankles/feet/toes.; Neuro: AA&Ox3, Major CN grossly intact. No facial droop. Speech clear. No gross focal motor or sensory deficits in extremities.; Skin: Color normal, Warm, Dry.   ED Treatments / Results  Labs (all labs ordered are listed, but only abnormal results are displayed)   EKG  EKG Interpretation  Date/Time:  Tuesday November 17 2016 09:27:28 EDT Ventricular Rate:  97 PR Interval:    QRS Duration: 98 QT Interval:  349 QTC Calculation: 444 R Axis:   69 Text Interpretation:  Sinus rhythm Abnormal R-wave progression, early transition Biatrial enlargement No old tracing to compare Confirmed by Francine Graven [phone]) on 11/17/2016 10:53:26 AM       Radiology   Procedures Procedures (including critical care time)  Medications Ordered in ED Medications  0.9 %  sodium chloride infusion ( Intravenous New Bag/Given 11/17/16 1126)  sodium chloride 0.9 % bolus 1,000 mL (1,000 mLs Intravenous New Bag/Given 11/17/16 1126)     Initial Impression / Assessment and Plan / ED Course  I have reviewed the triage vital signs and the nursing notes.  Pertinent labs & imaging results that were available during my care of the patient were reviewed by  me and considered in my medical decision making (see chart for details).  MDM Reviewed: previous chart, nursing note and vitals Reviewed previous: labs and ECG Interpretation: labs, ECG, x-ray and CT scan   Results for orders placed or performed during the hospital encounter of 11/17/16  Comprehensive metabolic panel  Result Value Ref Range   Sodium 132 (L) 135 - 145 mmol/L   Potassium 4.1 3.5 - 5.1 mmol/L   Chloride 98 (L) 101 - 111 mmol/L   CO2 23 22 - 32 mmol/L   Glucose, Bld 169 (H) 65 - 99 mg/dL   BUN 20 6 - 20 mg/dL   Creatinine, Ser 1.94 (H) 0.61 - 1.24 mg/dL   Calcium 8.8 (L) 8.9 - 10.3 mg/dL   Total Protein 7.3 6.5 - 8.1 g/dL   Albumin 3.4 (L) 3.5 - 5.0 g/dL   AST  14 (L) 15 - 41 U/L   ALT 17 17 - 63 U/L   Alkaline Phosphatase 81 38 - 126 U/L   Total Bilirubin 1.8 (H) 0.3 - 1.2 mg/dL   GFR calc non Af Amer 35 (L) >60 mL/min   GFR calc Af Amer 41 (L) >60 mL/min   Anion gap 11 5 - 15  Troponin I  Result Value Ref Range   Troponin I <0.03 <0.03 ng/mL  Lactic acid, plasma  Result Value Ref Range   Lactic Acid, Venous 1.5 0.5 - 1.9 mmol/L  Lactic acid, plasma  Result Value Ref Range   Lactic Acid, Venous 1.4 0.5 - 1.9 mmol/L  CBC with Differential  Result Value Ref Range   WBC 21.0 (H) 4.0 - 10.5 K/uL   RBC 4.68 4.22 - 5.81 MIL/uL   Hemoglobin 13.3 13.0 - 17.0 g/dL   HCT 41.3 39.0 - 52.0 %   MCV 88.2 78.0 - 100.0 fL   MCH 28.4 26.0 - 34.0 pg   MCHC 32.2 30.0 - 36.0 g/dL   RDW 14.4 11.5 - 15.5 %   Platelets 267 150 - 400 K/uL   Neutrophils Relative % 73 %   Neutro Abs 15.4 (H) 1.7 - 7.7 K/uL   Lymphocytes Relative 13 %   Lymphs Abs 2.7 0.7 - 4.0 K/uL   Monocytes Relative 14 %   Monocytes Absolute 2.9 (H) 0.1 - 1.0 K/uL   Eosinophils Relative 0 %   Eosinophils Absolute 0.0 0.0 - 0.7 K/uL   Basophils Relative 0 %   Basophils Absolute 0.0 0.0 - 0.1 K/uL  Urinalysis, Routine w reflex microscopic  Result Value Ref Range   Color, Urine YELLOW YELLOW    APPearance HAZY (A) CLEAR   Specific Gravity, Urine 1.011 1.005 - 1.030   pH 5.0 5.0 - 8.0   Glucose, UA NEGATIVE NEGATIVE mg/dL   Hgb urine dipstick SMALL (A) NEGATIVE   Bilirubin Urine NEGATIVE NEGATIVE   Ketones, ur NEGATIVE NEGATIVE mg/dL   Protein, ur 30 (A) NEGATIVE mg/dL   Nitrite NEGATIVE NEGATIVE   Leukocytes, UA NEGATIVE NEGATIVE   RBC / HPF 0-5 0 - 5 RBC/hpf   WBC, UA 0-5 0 - 5 WBC/hpf   Bacteria, UA RARE (A) NONE SEEN   Squamous Epithelial / LPF 0-5 (A) NONE SEEN   Mucus PRESENT    Dg Chest 2 View Result Date: 11/17/2016 CLINICAL DATA:  Syncopal episode EXAM: CHEST  2 VIEW COMPARISON:  None. FINDINGS: The heart size and mediastinal contours are within normal limits. Both lungs are clear. The visualized skeletal structures are unremarkable. IMPRESSION: No active cardiopulmonary disease. Electronically Signed   By: Inez Catalina M.D.   On: 11/17/2016 11:24   Ct Head Wo Contrast Result Date: 11/17/2016 CLINICAL DATA:  Near syncope today EXAM: CT HEAD WITHOUT CONTRAST TECHNIQUE: Contiguous axial images were obtained from the base of the skull through the vertex without intravenous contrast. COMPARISON:  None. FINDINGS: Brain: No evidence of malformation, atrophy, old or acute small or large vessel infarction, mass lesion, hemorrhage, hydrocephalus or extra-axial collection. No evidence of pituitary lesion. Vascular: No vascular calcification.  No hyperdense vessels. Skull: Normal.  No fracture or focal bone lesion. Sinuses/Orbits: Visualized sinuses are clear. No fluid in the middle ears or mastoids. Visualized orbits are normal. Other: None significant IMPRESSION: Normal head CT. Electronically Signed   By: Nelson Chimes M.D.   On: 11/17/2016 11:09     1315:  Unable to stand for orthostatic  VS. BP improved after judicious IVF bolus. BUN/Cr elevated, no old to compare. WBC count elevated but lactic acid x2 normal and pt remains afebrile; elevation likely due to prednisone.  Dx and  testing d/w pt and family.  Questions answered.  Verb understanding, agreeable to admit.  T/C to [facility] Dr. Carles Collet, case discussed, including:  HPI, pertinent PM/SHx, VS/PE, dx testing, ED course and treatment:  Agreeable to admit.   Final Clinical Impressions(s) / ED Diagnoses   Final diagnoses:  None    New Prescriptions New Prescriptions   No medications on file     Francine Graven, DO 11/19/16 2153

## 2016-11-17 NOTE — ED Notes (Signed)
Currently have a slight headache but no other symptoms at this time.

## 2016-11-18 ENCOUNTER — Observation Stay (HOSPITAL_COMMUNITY)
Admit: 2016-11-18 | Discharge: 2016-11-18 | Disposition: A | Discharge status: Home / Self Care | Payer: BLUE CROSS/BLUE SHIELD | Attending: Internal Medicine | Admitting: Internal Medicine

## 2016-11-18 DIAGNOSIS — R55 Syncope and collapse: Principal | ICD-10-CM

## 2016-11-18 DIAGNOSIS — M10471 Other secondary gout, right ankle and foot: Secondary | ICD-10-CM

## 2016-11-18 DIAGNOSIS — N183 Chronic kidney disease, stage 3 (moderate): Secondary | ICD-10-CM | POA: Diagnosis not present

## 2016-11-18 DIAGNOSIS — E871 Hypo-osmolality and hyponatremia: Secondary | ICD-10-CM | POA: Diagnosis not present

## 2016-11-18 DIAGNOSIS — I959 Hypotension, unspecified: Secondary | ICD-10-CM

## 2016-11-18 DIAGNOSIS — G4089 Other seizures: Secondary | ICD-10-CM | POA: Diagnosis not present

## 2016-11-18 LAB — BASIC METABOLIC PANEL
Anion gap: 9 (ref 5–15)
BUN: 20 mg/dL (ref 6–20)
CHLORIDE: 103 mmol/L (ref 101–111)
CO2: 26 mmol/L (ref 22–32)
Calcium: 9 mg/dL (ref 8.9–10.3)
Creatinine, Ser: 1.57 mg/dL — ABNORMAL HIGH (ref 0.61–1.24)
GFR calc Af Amer: 52 mL/min — ABNORMAL LOW (ref >60)
GFR calc non Af Amer: 45 mL/min — ABNORMAL LOW (ref >60)
GLUCOSE: 146 mg/dL — AB (ref 65–99)
POTASSIUM: 4.6 mmol/L (ref 3.5–5.1)
SODIUM: 138 mmol/L (ref 135–145)

## 2016-11-18 LAB — HIV ANTIBODY (ROUTINE TESTING W REFLEX): HIV Screen 4th Generation wRfx: NONREACTIVE

## 2016-11-18 LAB — URINE CULTURE: CULTURE: NO GROWTH

## 2016-11-18 LAB — CBC
HEMATOCRIT: 39.4 % (ref 39.0–52.0)
Hemoglobin: 12.5 g/dL — ABNORMAL LOW (ref 13.0–17.0)
MCH: 27.8 pg (ref 26.0–34.0)
MCHC: 31.7 g/dL (ref 30.0–36.0)
MCV: 87.8 fL (ref 78.0–100.0)
Platelets: 236 10*3/uL (ref 150–400)
RBC: 4.49 MIL/uL (ref 4.22–5.81)
RDW: 14.2 % (ref 11.5–15.5)
WBC: 12.5 10*3/uL — AB (ref 4.0–10.5)

## 2016-11-18 LAB — BILIRUBIN, FRACTIONATED(TOT/DIR/INDIR)
BILIRUBIN INDIRECT: 0.9 mg/dL (ref 0.3–0.9)
Bilirubin, Direct: 0.1 mg/dL (ref 0.1–0.5)
Total Bilirubin: 1 mg/dL (ref 0.3–1.2)

## 2016-11-18 MED ORDER — PREDNISONE 10 MG PO TABS: ORAL_TABLET | ORAL | 0 refills | Status: DC

## 2016-11-18 NOTE — Plan of Care (Signed)
Problem: Pain Managment: Goal: General experience of comfort will improve Outcome: Progressing Pt has had no c/o pain this shift. Pt was educated on pain mgmt as well as medications available. Pt verbalized understanding. Will continue to monitor pt

## 2016-11-18 NOTE — Progress Notes (Signed)
EEG completed, results pending. 

## 2016-11-18 NOTE — Procedures (Signed)
  [place] A. Merlene Laughter, MD     [email or web address]           HISTORY: The patient is a 61 year old man who had a spell of syncope associated with eyes rolled back and loss of consciousness suspicious for seizure.  MEDICATIONS: Scheduled Meds: Continuous Infusions: PRN Meds:.  Prior to Admission medications   Medication Sig Start Date End Date Taking? Authorizing Provider  allopurinol (ZYLOPRIM) 300 MG tablet Take 300 mg by mouth daily. 11/03/16   [provider]  colchicine 0.6 MG tablet Take 0.6 mg by mouth 2 (two) times daily as needed. Gout flairups 11/03/16   [provider]  predniSONE (DELTASONE) 10 MG tablet Take 40mg  po daily for 2 days then 30mg  daily for 2 days then 20mg  daily for 2 days then 10mg  daily for 2 days then stop 11/18/16   Kathie Dike, MD  traMADol (ULTRAM) 50 MG tablet Take 50 mg by mouth 3 (three) times daily. 11/03/16   [provider]      ANALYSIS: A 16 channel recording using standard 10 20 measurements is conducted for twenty-one  minutes.  There is a well-formed posterior dominant rhythm of 10 hertz which attenuates with eye opening.  There is beta activity observed in the frontal areas.  Awake and drowsy activities are observed.  Hyperventilation is not carried out but photic stimulation is done.  There is no significant changes in the background activity with photic stimulation.  There is no focal or lateralized slowing.  There is no epileptiform activity is observed.   IMPRESSION:   This is a normal recording of awake and drowsy states.      Malay Fantroy A. Merlene Laughter, M.D.  Diplomate, Tax adviser of Psychiatry and Neurology ( Neurology).

## 2016-11-18 NOTE — Discharge Summary (Signed)
Physician Discharge Summary  Dale Green IDP:[id number] DOB: 1955/03/11 DOA: 11/17/2016  PCP: [facility]  Admit date: 11/17/2016 Discharge date: 11/18/2016  Admitted From: Home Disposition: Home  Recommendations for Outpatient Follow-up:  1. Follow up with PCP in 1-2 weeks 2. Please obtain BMP/CBC in one week 3. Patient plans to follow-up with a new rheumatologist.  Home Health: Equipment/Devices:  Discharge Condition: Stable CODE STATUS: Full code Diet recommendation: Heart Healthy   Brief/Interim Summary: 61 year old male with a history of gouty arthritis, presents to the hospital with complaints of syncope.  Patient was found to be filling depleted.  This was evident by elevated creatinine and small IVC suggesting low RA pressure and hypovolemia.  Patient was hydrated with IV fluids and has significantly improved.  Creatinine has trended down from 1.9-1.5.  He is no longer dizzy on standing and generally feels better.  He has been advised to continue with hydration after discharge.  He is also been advised to avoid NSAIDs considering his chronic kidney disease.  Patient also complained of bilateral ankle pain, swelling consistent with acute gouty attack.  He received a dose of Solu-Medrol and has significantly improved since admission.  He reports that his primary care physician had recently started him on allopurinol.  He will be continued on the same.  We will transition to a course of prednisone.  He is now able to ambulate without significant pain.  He plans to follow-up with a new rheumatologist, since his primary rheumatologist has retired.  Patient otherwise is feeling better and feels ready for discharge home.  Discharge Diagnoses:  Active Problems:   Syncope   Acute gout   Hyponatremia   CKD (chronic kidney disease) stage 3, GFR 30-59 ml/min (HCC)   Hyperglycemia   Hyperbilirubinemia    Discharge Instructions  Discharge Instructions    Diet  - low sodium heart healthy    Complete by:  As directed    Increase activity slowly    Complete by:  As directed      Allergies as of 11/18/2016   No Known Allergies     Medication List    TAKE these medications   allopurinol 300 MG tablet Commonly known as:  ZYLOPRIM Take 300 mg by mouth daily.   colchicine 0.6 MG tablet Take 0.6 mg by mouth 2 (two) times daily as needed. Gout flairups   predniSONE 10 MG tablet Commonly known as:  DELTASONE Take 40mg  po daily for 2 days then 30mg  daily for 2 days then 20mg  daily for 2 days then 10mg  daily for 2 days then stop   traMADol 50 MG tablet Commonly known as:  ULTRAM Take 50 mg by mouth 3 (three) times daily.       No Known Allergies  Consultations:     Procedures/Studies: Dg Chest 2 View  Result Date: 11/17/2016 CLINICAL DATA:  Syncopal episode EXAM: CHEST  2 VIEW COMPARISON:  None. FINDINGS: The heart size and mediastinal contours are within normal limits. Both lungs are clear. The visualized skeletal structures are unremarkable. IMPRESSION: No active cardiopulmonary disease. Electronically Signed   By: Inez Catalina M.D.   On: 11/17/2016 11:24   Ct Head Wo Contrast  Result Date: 11/17/2016 CLINICAL DATA:  Near syncope today EXAM: CT HEAD WITHOUT CONTRAST TECHNIQUE: Contiguous axial images were obtained from the base of the skull through the vertex without intravenous contrast. COMPARISON:  None. FINDINGS: Brain: No evidence of malformation, atrophy, old or acute small or large vessel infarction, mass lesion,  hemorrhage, hydrocephalus or extra-axial collection. No evidence of pituitary lesion. Vascular: No vascular calcification.  No hyperdense vessels. Skull: Normal.  No fracture or focal bone lesion. Sinuses/Orbits: Visualized sinuses are clear. No fluid in the middle ears or mastoids. Visualized orbits are normal. Other: None significant IMPRESSION: Normal head CT. Electronically Signed   By: Nelson Chimes M.D.   On:  11/17/2016 11:09    Echo:- Left ventricle: The cavity size was normal. Wall thickness was   increased in a pattern of mild LVH. Systolic function was normal.   The estimated ejection fraction was in the range of 60% to 65%.   Wall motion was normal; there were no regional wall motion   abnormalities. Left ventricular diastolic function parameters   were normal. - Aortic valve: Valve area (VTI): 3.12 cm^2. Valve area (Vmax):   2.63 cm^2. - Systemic veins: IVC is small, suggesting low RA pressure and   hypovolemia. - Technically difficult study. Echocontrast was used to enhance   visualization.  EEG: Done with report pending  Subjective: Feeling better today.  Pain in the ankles bilaterally has improved.  He is able to ambulate.  He is not dizzy on standing.  Discharge Exam: Vitals:   11/18/16 1300 11/18/16 1401  BP:  (!) 140/59  Pulse:  86  Resp:  19  Temp:  98.3 F (36.8 C)  SpO2: 96% 96%   Vitals:   11/17/16 2046 11/17/16 2107 11/18/16 1300 11/18/16 1401  BP:  140/65  (!) 140/59  Pulse:  88  86  Resp:  18  19  Temp:  97.9 F (36.6 C)  98.3 F (36.8 C)  TempSrc:  Oral  Oral  SpO2: 92% 96% 96% 96%  Weight:      Height:        General: Pt is alert, awake, not in acute distress Cardiovascular: RRR, S1/S2 +, no rubs, no gallops Respiratory: CTA bilaterally, no wheezing, no rhonchi Abdominal: Soft, NT, ND, bowel sounds + Extremities: no edema, no cyanosis    The results of significant diagnostics from this hospitalization (including imaging, microbiology, ancillary and laboratory) are listed below for reference.     Microbiology: Recent Results (from the past 240 hour(s))  Urine culture     Status: None   Collection Time: 11/17/16 10:11 AM  Result Value Ref Range Status   Specimen Description URINE, RANDOM  Final   Special Requests NONE  Final   Culture   Final    NO GROWTH Performed at [facility], [address], [place], [place] [address]     Report Status 11/18/2016 FINAL  Final  Culture, blood (Routine X 2) w Reflex to ID Panel     Status: None (Preliminary result)   Collection Time: 11/17/16  3:34 PM  Result Value Ref Range Status   Specimen Description LEFT ANTECUBITAL  Final   Special Requests   Final    BOTTLES DRAWN AEROBIC AND ANAEROBIC Blood Culture results may not be optimal due to an excessive volume of blood received in culture bottles   Culture NO GROWTH < 24 HOURS  Final   Report Status PENDING  Incomplete  Culture, blood (Routine X 2) w Reflex to ID Panel     Status: None (Preliminary result)   Collection Time: 11/17/16  3:40 PM  Result Value Ref Range Status   Specimen Description BLOOD LEFT FOREARM  Final   Special Requests   Final    BOTTLES DRAWN AEROBIC AND ANAEROBIC Blood Culture  adequate volume   Culture NO GROWTH < 24 HOURS  Final   Report Status PENDING  Incomplete     Labs: BNP (last 3 results) No results for input(s): BNP in the last 8760 hours. Basic Metabolic Panel:  Recent Labs Lab 11/17/16 1010 11/18/16 0532  NA 132* 138  K 4.1 4.6  CL 98* 103  CO2 23 26  GLUCOSE 169* 146*  BUN 20 20  CREATININE 1.94* 1.57*  CALCIUM 8.8* 9.0   Liver Function Tests:  Recent Labs Lab 11/17/16 1010 11/18/16 0532  AST 14*  --   ALT 17  --   ALKPHOS 81  --   BILITOT 1.8* 1.0  PROT 7.3  --   ALBUMIN 3.4*  --    No results for input(s): LIPASE, AMYLASE in the last 168 hours. No results for input(s): AMMONIA in the last 168 hours. CBC:  Recent Labs Lab 11/17/16 1010 11/18/16 0532  WBC 21.0* 12.5*  NEUTROABS 15.4*  --   HGB 13.3 12.5*  HCT 41.3 39.4  MCV 88.2 87.8  PLT 267 236   Cardiac Enzymes:  Recent Labs Lab 11/17/16 1010 11/17/16 1541  CKTOTAL  --  46*  TROPONINI <0.03  --    BNP: Invalid input(s): POCBNP CBG: No results for input(s): GLUCAP in the last 168 hours. D-Dimer No results for input(s): DDIMER in the last 72 hours. Hgb A1c  Recent Labs  11/17/16 1541   HGBA1C 5.8*   Lipid Profile No results for input(s): CHOL, HDL, LDLCALC, TRIG, CHOLHDL, LDLDIRECT in the last 72 hours. Thyroid function studies No results for input(s): TSH, T4TOTAL, T3FREE, THYROIDAB in the last 72 hours.  Invalid input(s): FREET3 Anemia work up No results for input(s): VITAMINB12, FOLATE, FERRITIN, TIBC, IRON, RETICCTPCT in the last 72 hours. Urinalysis    Component Value Date/Time   COLORURINE YELLOW 11/17/2016 1011   APPEARANCEUR HAZY (A) 11/17/2016 1011   LABSPEC 1.011 11/17/2016 1011   PHURINE 5.0 11/17/2016 1011   GLUCOSEU NEGATIVE 11/17/2016 1011   HGBUR SMALL (A) 11/17/2016 1011   BILIRUBINUR NEGATIVE 11/17/2016 [place] 11/17/2016 1011   PROTEINUR 30 (A) 11/17/2016 1011   NITRITE NEGATIVE 11/17/2016 [place] 11/17/2016 1011   Sepsis Labs Invalid input(s): PROCALCITONIN,  WBC,  LACTICIDVEN Microbiology Recent Results (from the past 240 hour(s))  Urine culture     Status: None   Collection Time: 11/17/16 10:11 AM  Result Value Ref Range Status   Specimen Description URINE, RANDOM  Final   Special Requests NONE  Final   Culture   Final    NO GROWTH Performed at [facility], [place] [address], [place], [place] [address]    Report Status 11/18/2016 FINAL  Final  Culture, blood (Routine X 2) w Reflex to ID Panel     Status: None (Preliminary result)   Collection Time: 11/17/16  3:34 PM  Result Value Ref Range Status   Specimen Description LEFT ANTECUBITAL  Final   Special Requests   Final    BOTTLES DRAWN AEROBIC AND ANAEROBIC Blood Culture results may not be optimal due to an excessive volume of blood received in culture bottles   Culture NO GROWTH < 24 HOURS  Final   Report Status PENDING  Incomplete  Culture, blood (Routine X 2) w Reflex to ID Panel     Status: None (Preliminary result)   Collection Time: 11/17/16  3:40 PM  Result Value Ref Range Status   Specimen Description  BLOOD LEFT FOREARM   Final   Special Requests   Final    BOTTLES DRAWN AEROBIC AND ANAEROBIC Blood Culture adequate volume   Culture NO GROWTH < 24 HOURS  Final   Report Status PENDING  Incomplete     Time coordinating discharge: Over 30 minutes  SIGNED:   Kathie Dike, MD  [facility] 11/18/2016, 3:15 PM Pager   If 7PM-7AM, please contact night-coverage [email or web address] Password [email or web address]

## 2016-11-18 NOTE — Care Management (Signed)
Admitted with syncope. Chart reviewed for CM needs. Pt from home, ind with ADL's. Has PCP, transportation, and insurance with drug coverage. No CM needs noted. Anitcipate DC home later today or tomorrow after workup complete.

## 2016-11-19 NOTE — Progress Notes (Signed)
Patient is to be discharged home and in stable condition. Patient given discharge instructions and verbalized understanding. Patient will be transported out by staff via wheelchair.   Celestia Khat, RN

## 2016-11-22 LAB — CULTURE, BLOOD (ROUTINE X 2)
CULTURE: NO GROWTH
Culture: NO GROWTH
SPECIAL REQUESTS: ADEQUATE

## 2016-12-08 DIAGNOSIS — I10 Essential (primary) hypertension: Secondary | ICD-10-CM | POA: Diagnosis not present

## 2016-12-08 DIAGNOSIS — M1990 Unspecified osteoarthritis, unspecified site: Secondary | ICD-10-CM | POA: Diagnosis not present

## 2016-12-08 DIAGNOSIS — Z6839 Body mass index (BMI) 39.0-39.9, adult: Secondary | ICD-10-CM | POA: Diagnosis not present

## 2016-12-08 DIAGNOSIS — Z1389 Encounter for screening for other disorder: Secondary | ICD-10-CM | POA: Diagnosis not present

## 2016-12-08 DIAGNOSIS — E860 Dehydration: Secondary | ICD-10-CM | POA: Diagnosis not present

## 2016-12-08 DIAGNOSIS — M1A00X0 Idiopathic chronic gout, unspecified site, without tophus (tophi): Secondary | ICD-10-CM | POA: Diagnosis not present

## 2016-12-08 DIAGNOSIS — M064 Inflammatory polyarthropathy: Secondary | ICD-10-CM | POA: Diagnosis not present

## 2016-12-08 DIAGNOSIS — G629 Polyneuropathy, unspecified: Secondary | ICD-10-CM | POA: Diagnosis not present

## 2016-12-08 DIAGNOSIS — E782 Mixed hyperlipidemia: Secondary | ICD-10-CM | POA: Diagnosis not present

## 2016-12-08 DIAGNOSIS — R55 Syncope and collapse: Secondary | ICD-10-CM | POA: Diagnosis not present

## 2016-12-08 DIAGNOSIS — K439 Ventral hernia without obstruction or gangrene: Secondary | ICD-10-CM | POA: Diagnosis not present

## 2016-12-23 DIAGNOSIS — M25569 Pain in unspecified knee: Secondary | ICD-10-CM | POA: Diagnosis not present

## 2016-12-23 DIAGNOSIS — M19042 Primary osteoarthritis, left hand: Secondary | ICD-10-CM | POA: Diagnosis not present

## 2016-12-23 DIAGNOSIS — M79641 Pain in right hand: Secondary | ICD-10-CM | POA: Diagnosis not present

## 2016-12-23 DIAGNOSIS — M79642 Pain in left hand: Secondary | ICD-10-CM | POA: Diagnosis not present

## 2016-12-23 DIAGNOSIS — M19072 Primary osteoarthritis, left ankle and foot: Secondary | ICD-10-CM | POA: Diagnosis not present

## 2016-12-23 DIAGNOSIS — M79673 Pain in unspecified foot: Secondary | ICD-10-CM | POA: Diagnosis not present

## 2016-12-23 DIAGNOSIS — M25561 Pain in right knee: Secondary | ICD-10-CM | POA: Diagnosis not present

## 2016-12-23 DIAGNOSIS — M25539 Pain in unspecified wrist: Secondary | ICD-10-CM | POA: Diagnosis not present

## 2016-12-23 DIAGNOSIS — M19071 Primary osteoarthritis, right ankle and foot: Secondary | ICD-10-CM | POA: Diagnosis not present

## 2016-12-23 DIAGNOSIS — M19041 Primary osteoarthritis, right hand: Secondary | ICD-10-CM | POA: Diagnosis not present

## 2016-12-23 DIAGNOSIS — M179 Osteoarthritis of knee, unspecified: Secondary | ICD-10-CM | POA: Diagnosis not present

## 2016-12-23 DIAGNOSIS — M109 Gout, unspecified: Secondary | ICD-10-CM | POA: Diagnosis not present

## 2017-01-20 DIAGNOSIS — M109 Gout, unspecified: Secondary | ICD-10-CM | POA: Diagnosis not present

## 2017-01-20 DIAGNOSIS — N183 Chronic kidney disease, stage 3 (moderate): Secondary | ICD-10-CM | POA: Diagnosis not present

## 2017-01-20 DIAGNOSIS — M25569 Pain in unspecified knee: Secondary | ICD-10-CM | POA: Diagnosis not present

## 2017-01-20 DIAGNOSIS — M79673 Pain in unspecified foot: Secondary | ICD-10-CM | POA: Diagnosis not present

## 2017-02-17 DIAGNOSIS — M109 Gout, unspecified: Secondary | ICD-10-CM | POA: Diagnosis not present

## 2017-02-17 DIAGNOSIS — M79673 Pain in unspecified foot: Secondary | ICD-10-CM | POA: Diagnosis not present

## 2017-02-17 DIAGNOSIS — M25569 Pain in unspecified knee: Secondary | ICD-10-CM | POA: Diagnosis not present

## 2017-02-17 DIAGNOSIS — N183 Chronic kidney disease, stage 3 (moderate): Secondary | ICD-10-CM | POA: Diagnosis not present

## 2017-03-09 DIAGNOSIS — M1000 Idiopathic gout, unspecified site: Secondary | ICD-10-CM | POA: Diagnosis not present

## 2017-03-09 DIAGNOSIS — M25562 Pain in left knee: Secondary | ICD-10-CM | POA: Diagnosis not present

## 2017-03-09 DIAGNOSIS — M170 Bilateral primary osteoarthritis of knee: Secondary | ICD-10-CM | POA: Diagnosis not present

## 2017-03-09 DIAGNOSIS — M25561 Pain in right knee: Secondary | ICD-10-CM | POA: Diagnosis not present

## 2017-03-30 DIAGNOSIS — M25569 Pain in unspecified knee: Secondary | ICD-10-CM | POA: Diagnosis not present

## 2017-03-30 DIAGNOSIS — N183 Chronic kidney disease, stage 3 (moderate): Secondary | ICD-10-CM | POA: Diagnosis not present

## 2017-03-30 DIAGNOSIS — M109 Gout, unspecified: Secondary | ICD-10-CM | POA: Diagnosis not present

## 2017-03-30 DIAGNOSIS — M79673 Pain in unspecified foot: Secondary | ICD-10-CM | POA: Diagnosis not present

## 2017-05-16 IMAGING — DX DG ELBOW COMPLETE 3+V*R*
4 series · 4 of 4 positions shown · non-contrast
Comparison: None.

CLINICAL DATA: Right elbow pain with palpable abnormalities
bilaterally

EXAM:
RIGHT ELBOW - COMPLETE 3+ VIEW

[elbow ap]
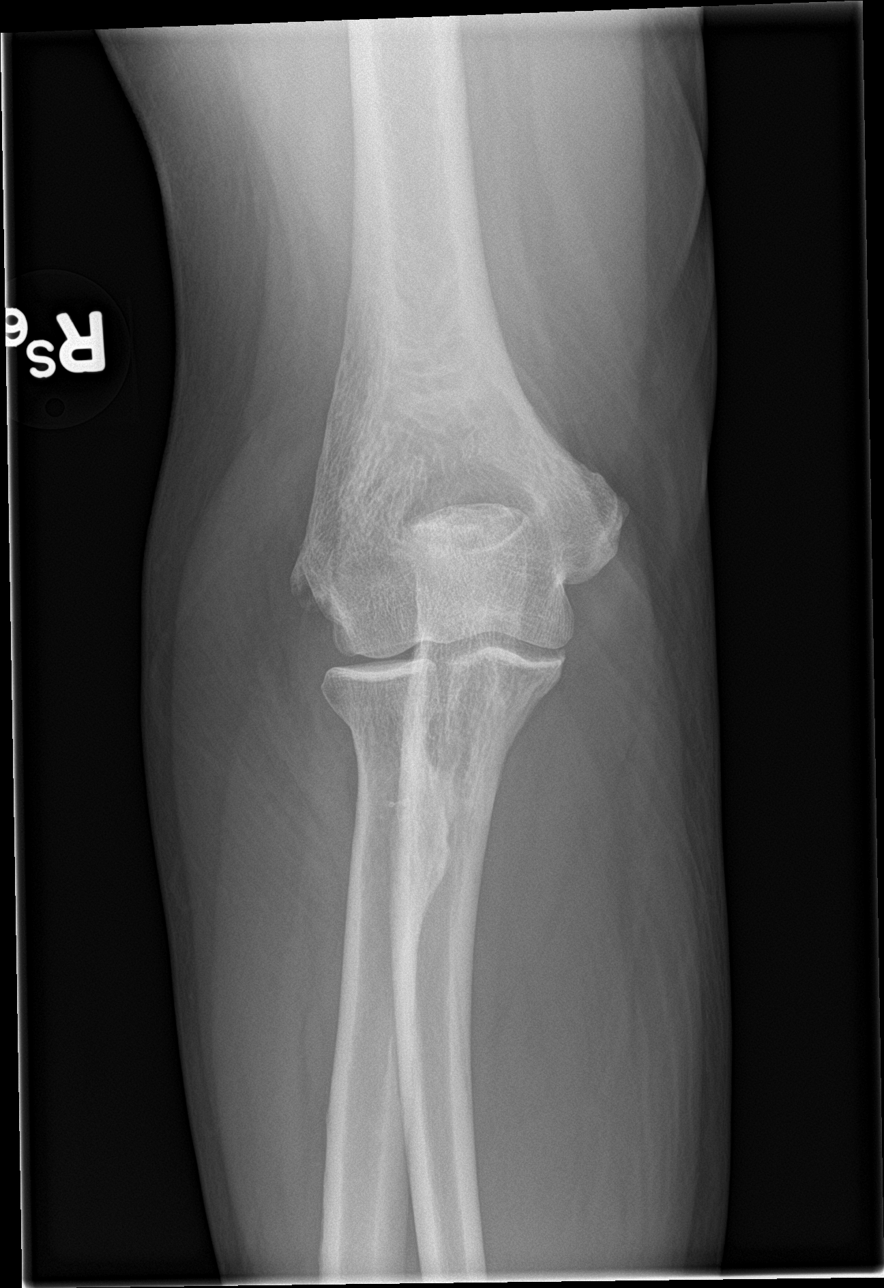

[elbow obl (1 of 2)]
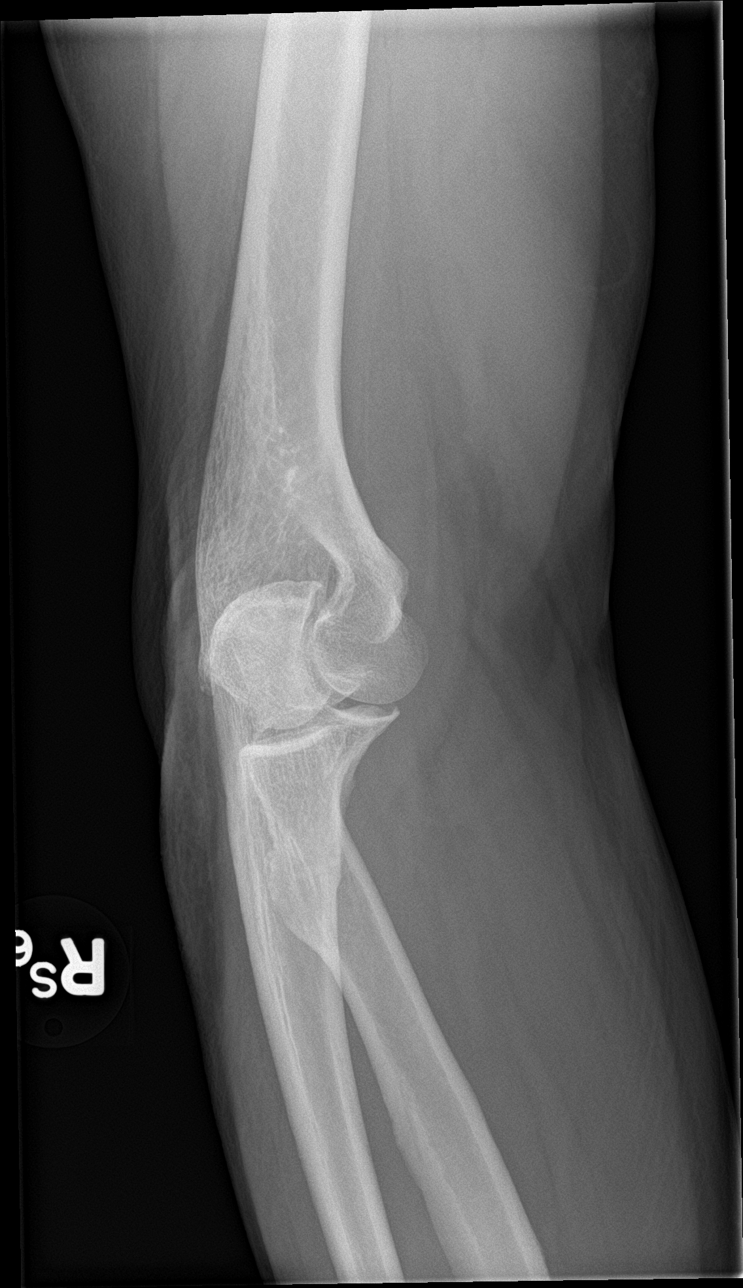

[elbow obl (2 of 2)]
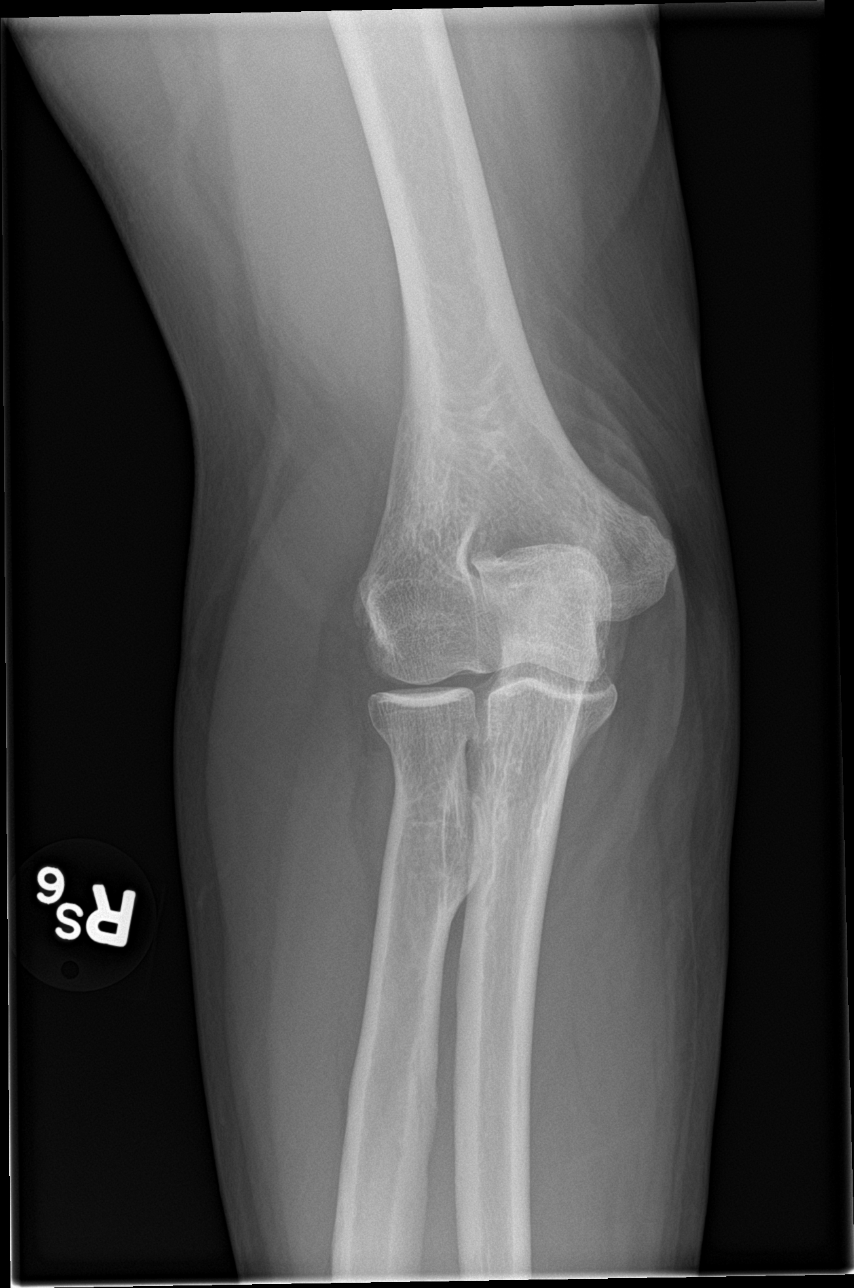

[elbow lat]
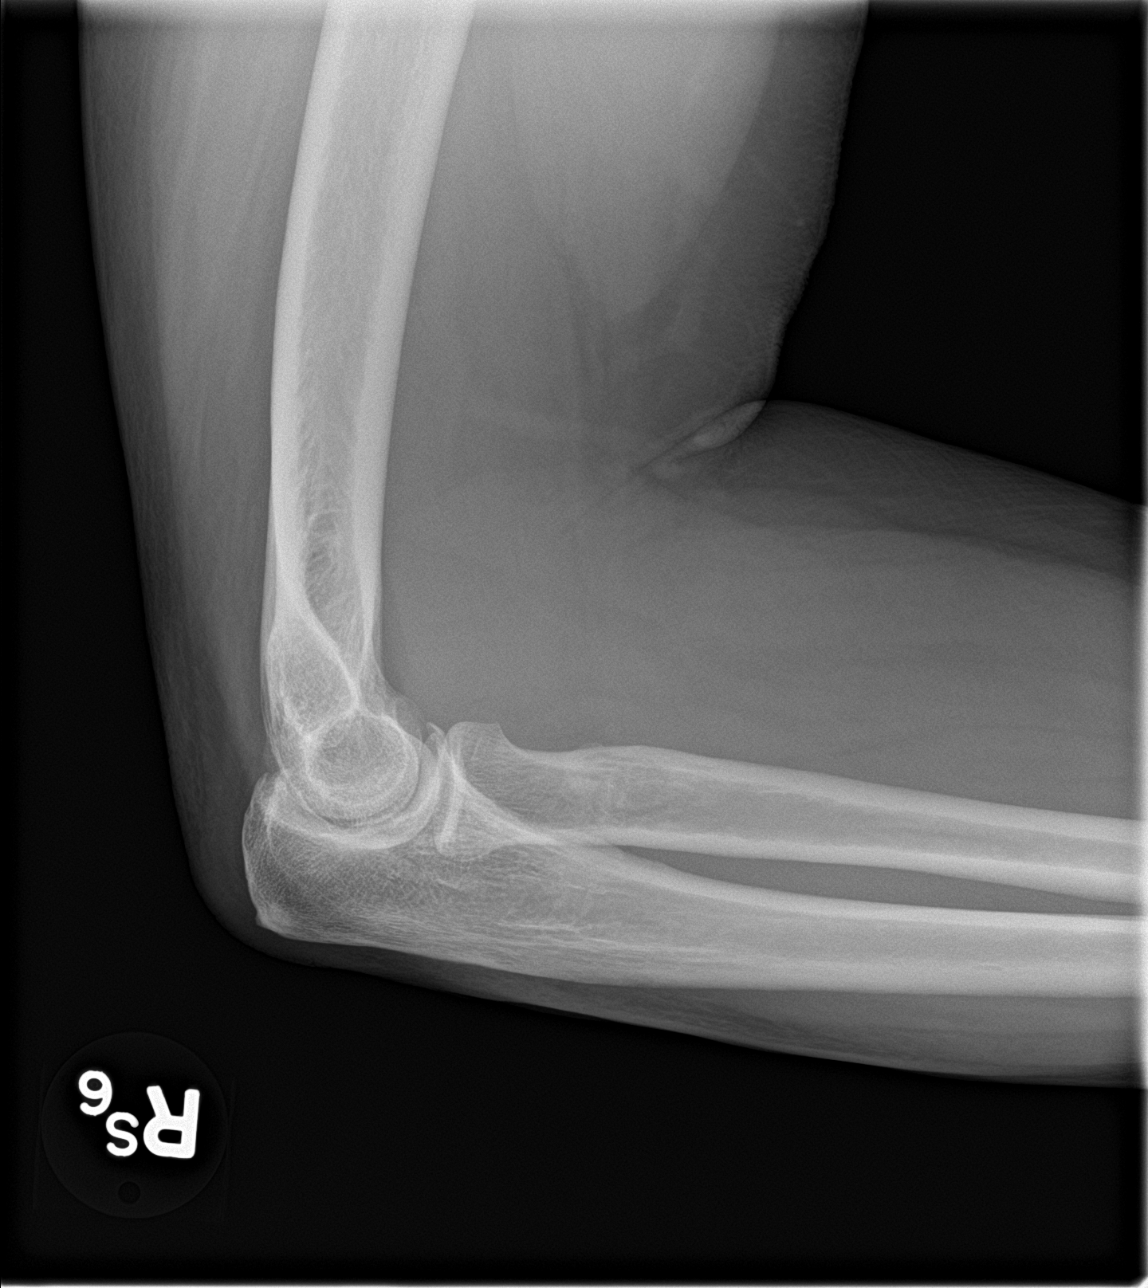

[4 of 4 positions shown; findings below may reference images not displayed]

FINDINGS: No acute fracture or dislocation is noted. No gross soft tissue
abnormality is noted. Some osteophytic formation is noted laterally
arising from the distal humerus.
IMPRESSION: Mild degenerative change without acute abnormality.

## 2017-05-19 DIAGNOSIS — N183 Chronic kidney disease, stage 3 (moderate): Secondary | ICD-10-CM | POA: Diagnosis not present

## 2017-05-19 DIAGNOSIS — M25569 Pain in unspecified knee: Secondary | ICD-10-CM | POA: Diagnosis not present

## 2017-05-19 DIAGNOSIS — M109 Gout, unspecified: Secondary | ICD-10-CM | POA: Diagnosis not present

## 2017-05-19 DIAGNOSIS — M79673 Pain in unspecified foot: Secondary | ICD-10-CM | POA: Diagnosis not present

## 2017-07-05 DIAGNOSIS — M79673 Pain in unspecified foot: Secondary | ICD-10-CM | POA: Diagnosis not present

## 2017-07-05 DIAGNOSIS — N183 Chronic kidney disease, stage 3 (moderate): Secondary | ICD-10-CM | POA: Diagnosis not present

## 2017-07-05 DIAGNOSIS — M109 Gout, unspecified: Secondary | ICD-10-CM | POA: Diagnosis not present

## 2017-07-05 DIAGNOSIS — M25569 Pain in unspecified knee: Secondary | ICD-10-CM | POA: Diagnosis not present

## 2017-09-03 DIAGNOSIS — Z0001 Encounter for general adult medical examination with abnormal findings: Secondary | ICD-10-CM | POA: Diagnosis not present

## 2017-09-03 DIAGNOSIS — I10 Essential (primary) hypertension: Secondary | ICD-10-CM | POA: Diagnosis not present

## 2017-09-03 DIAGNOSIS — Z1389 Encounter for screening for other disorder: Secondary | ICD-10-CM | POA: Diagnosis not present

## 2017-09-03 DIAGNOSIS — N183 Chronic kidney disease, stage 3 (moderate): Secondary | ICD-10-CM | POA: Diagnosis not present

## 2017-09-03 DIAGNOSIS — E782 Mixed hyperlipidemia: Secondary | ICD-10-CM | POA: Diagnosis not present

## 2017-09-03 DIAGNOSIS — Z23 Encounter for immunization: Secondary | ICD-10-CM | POA: Diagnosis not present

## 2017-09-10 DIAGNOSIS — M1990 Unspecified osteoarthritis, unspecified site: Secondary | ICD-10-CM | POA: Diagnosis not present

## 2017-09-10 DIAGNOSIS — N183 Chronic kidney disease, stage 3 (moderate): Secondary | ICD-10-CM | POA: Diagnosis not present

## 2017-09-10 DIAGNOSIS — M109 Gout, unspecified: Secondary | ICD-10-CM | POA: Diagnosis not present

## 2017-10-22 ENCOUNTER — Ambulatory Visit: Payer: BLUE CROSS/BLUE SHIELD | Admitting: Urology

## 2017-10-22 ENCOUNTER — Other Ambulatory Visit (HOSPITAL_COMMUNITY)
Admission: RE | Admit: 2017-10-22 | Discharge: 2017-10-22 | Disposition: A | Discharge status: Home / Self Care | Payer: BLUE CROSS/BLUE SHIELD | Source: Other Acute Inpatient Hospital | Attending: Urology | Admitting: Urology

## 2017-10-22 ENCOUNTER — Other Ambulatory Visit: Payer: Self-pay | Admitting: Urology

## 2017-10-22 DIAGNOSIS — R351 Nocturia: Secondary | ICD-10-CM | POA: Diagnosis not present

## 2017-10-22 DIAGNOSIS — N403 Nodular prostate with lower urinary tract symptoms: Secondary | ICD-10-CM | POA: Diagnosis not present

## 2017-10-22 DIAGNOSIS — R972 Elevated prostate specific antigen [PSA]: Secondary | ICD-10-CM

## 2017-10-22 LAB — URINALYSIS, COMPLETE (UACMP) WITH MICROSCOPIC
BILIRUBIN URINE: NEGATIVE
Bacteria, UA: NONE SEEN
GLUCOSE, UA: NEGATIVE mg/dL
Hgb urine dipstick: NEGATIVE
KETONES UR: NEGATIVE mg/dL
LEUKOCYTES UA: NEGATIVE
Nitrite: NEGATIVE
Protein, ur: 100 mg/dL — AB
Specific Gravity, Urine: 1.013 (ref 1.005–1.030)
pH: 5 (ref 5.0–8.0)

## 2017-10-23 LAB — URINE CULTURE: Culture: NO GROWTH

## 2017-12-10 ENCOUNTER — Ambulatory Visit (HOSPITAL_COMMUNITY)
Admission: RE | Admit: 2017-12-10 | Discharge: 2017-12-10 | Disposition: A | Discharge status: Home / Self Care | Payer: BLUE CROSS/BLUE SHIELD | Source: Ambulatory Visit | Attending: Urology | Admitting: Urology

## 2017-12-10 ENCOUNTER — Encounter (HOSPITAL_COMMUNITY): Payer: Self-pay

## 2017-12-10 DIAGNOSIS — N403 Nodular prostate with lower urinary tract symptoms: Secondary | ICD-10-CM | POA: Insufficient documentation

## 2017-12-10 DIAGNOSIS — R3915 Urgency of urination: Secondary | ICD-10-CM | POA: Insufficient documentation

## 2017-12-10 DIAGNOSIS — R351 Nocturia: Secondary | ICD-10-CM | POA: Insufficient documentation

## 2017-12-10 DIAGNOSIS — R3912 Poor urinary stream: Secondary | ICD-10-CM | POA: Diagnosis not present

## 2017-12-10 DIAGNOSIS — R972 Elevated prostate specific antigen [PSA]: Secondary | ICD-10-CM | POA: Diagnosis not present

## 2017-12-10 DIAGNOSIS — N183 Chronic kidney disease, stage 3 (moderate): Secondary | ICD-10-CM | POA: Insufficient documentation

## 2017-12-10 MED ORDER — LIDOCAINE HCL (PF) 1 % IJ SOLN
INTRAMUSCULAR | Status: AC
  Administered 2017-12-10: 2.1 mL
  Filled 2017-12-10: qty 5

## 2017-12-10 MED ORDER — LIDOCAINE HCL (PF) 2 % IJ SOLN
INTRAMUSCULAR | Status: AC
  Administered 2017-12-10: 10 mL
  Filled 2017-12-10: qty 10

## 2017-12-10 MED ORDER — CEFTRIAXONE SODIUM 1 G IJ SOLR
1.0000 g | Freq: Once | INTRAMUSCULAR | Status: AC
  Administered 2017-12-10: 1 g via INTRAMUSCULAR

## 2017-12-10 MED ORDER — CEFTRIAXONE SODIUM 1 G IJ SOLR
INTRAMUSCULAR | Status: AC
  Administered 2017-12-10: 1 g via INTRAMUSCULAR
  Filled 2017-12-10: qty 10

## 2017-12-31 ENCOUNTER — Ambulatory Visit: Payer: BLUE CROSS/BLUE SHIELD | Admitting: Urology

## 2017-12-31 NOTE — Procedures (Signed)
CC: My PSA is elevated above the normal range.  HPI: Dale Green is a 62 year-old male established patient who is here for an elevated PSA.  His PSA is 8.5. He has had PSA's drawn prior to this one. He has had elevated PSA's prior to this one.   Dale Green returns today for a prostate [place] and biopsy for his history of an elevated PSA and left apical nodule.   He is a 61 yo WM who is sent by Dr. Gerarda Fraction for an elevated PSA on recent labs of 8.5. It had been in the upper two to three range in the past. He has a gradually progressive reduction in the stream and nocturia x 2 over the past 2 years. He has had no dysuria or hematuria. He has some urgency recently but no UUI. He has no intermittency or sensation of incomplete emptying. He has no daytime frequency. He has had no prior UTI or prostatitis. He had bilateral ureteral reimplantations x 2 in the 70's for reflux. He has ED. He has CKD3. He has no family history of prostate cancer.     AUA Symptom Score: He never has the sensation of not emptying his bladder completely after finishing urinating. He never has to urinate again less that two hours after he has finished urinating. He does not have to stop and start again several times when he urinates. Less than 50% of the time he finds it difficult to postpone urination. Almost always he has a weak urinary stream. He never has to push or strain to begin urination. He has to get up to urinate 2 times from the time he goes to bed until the time he gets up in the morning.   Calculated AUA Symptom Score: 9    PROCEDURES:          TRUSP/BX - 55700, [id number], [id number], [id number], [email or web address] Reason for TRUSP: Elevated PSA     The patient confirmed that he had taken his pre-procedure antibiotic. Rocephin 1gm given IM. All anticoagulants were discontinued prior to the procedure. The patient emptied his bladder. He was positioned in a comfortable left lateral decubitus position with hips and knees acutely flexed.~~   Rectal Exam:      The rectal probe was inserted into the rectum without difficulty. Diagnostic scanning was performed with details below. 64ml of 2% xylocaine without epinephrine was instilled with a spinal needle using ultrasound guidance near the junction of each seminal vesicle and the prostate for a total of 108ml.  Sequential transverse (axial) scans were made in small increments beginning at the seminal vesicles and ending at the prostatic apex. Sequential longitudinal (sagittal) scans were made in small increments beginning at the right lateral prostate and ending at the left lateral prostate. Excellent anatomical imaging was obtained. The peripheral, transitional, and central zones were well-defined. The seminal vesicles were normal. There was a 1cm hypoechic nodule in the left mid medial TZ. There were no stones.   Prostate Volume (elliptical): Approximately 63 grams.    12 biopsies were performed in the standard configuration with one passing through the left mid medial TZ nodule. Excellent biopsy specimens were obtained.    The procedure was well-tolerated and without complications. The patient was told that: For several days:  he should increase his fluid intake and limit strenuous activity  he might have mild discomfort at the base of his penis or in his rectum  he might have blood in his urine or blood in his bowel  movements For 2-3 months:  he might have blood in his ejaculate (semen)  Instructions were given to call the office immedicately for blood clots in the urine or bowel movements, difficulty urinating, inability to urinate, urinary retention, painful or frequent urination, fever, chills or other worrisome illness. The patient stated that he understood these instructions and would comply with them. We told the patient that prostate biopsy pathology reports are usually available within 3-5 working days, unless a pathologic second opinion is required, which may take 7-14 days.  We told him to contact us to check on the status of his biopsy if he has not heard from [place] within 7 days. The patient left the ultrasound examination room in stable condition.~~    ASSESSMENT:      ICD-10 Details  1 GU:   Elevated PSA - R97.20   2   Prostate nodule w/ LUTS - N40.3      PLAN:           Document Letter(s):  Created for Patient: Clinical Summary         Notes:   I will call with the results.

## 2018-02-08 DIAGNOSIS — M109 Gout, unspecified: Secondary | ICD-10-CM | POA: Diagnosis not present

## 2018-02-08 DIAGNOSIS — M79673 Pain in unspecified foot: Secondary | ICD-10-CM | POA: Diagnosis not present

## 2018-02-08 DIAGNOSIS — N183 Chronic kidney disease, stage 3 (moderate): Secondary | ICD-10-CM | POA: Diagnosis not present

## 2018-02-08 DIAGNOSIS — M1990 Unspecified osteoarthritis, unspecified site: Secondary | ICD-10-CM | POA: Diagnosis not present

## 2018-04-08 DIAGNOSIS — M79673 Pain in unspecified foot: Secondary | ICD-10-CM | POA: Diagnosis not present

## 2018-04-08 DIAGNOSIS — M109 Gout, unspecified: Secondary | ICD-10-CM | POA: Diagnosis not present

## 2018-04-08 DIAGNOSIS — M1990 Unspecified osteoarthritis, unspecified site: Secondary | ICD-10-CM | POA: Diagnosis not present

## 2018-04-08 DIAGNOSIS — N183 Chronic kidney disease, stage 3 (moderate): Secondary | ICD-10-CM | POA: Diagnosis not present

## 2018-04-11 DIAGNOSIS — M109 Gout, unspecified: Secondary | ICD-10-CM | POA: Diagnosis not present

## 2018-08-17 ENCOUNTER — Other Ambulatory Visit: Payer: Self-pay | Admitting: Rheumatology

## 2018-08-17 DIAGNOSIS — M25561 Pain in right knee: Secondary | ICD-10-CM

## 2018-08-26 ENCOUNTER — Ambulatory Visit: Payer: BLUE CROSS/BLUE SHIELD | Admitting: Urology

## 2018-09-08 ENCOUNTER — Inpatient Hospital Stay: Admission: RE | Admit: 2018-09-08 | Payer: BLUE CROSS/BLUE SHIELD | Source: Ambulatory Visit

## 2018-09-10 ENCOUNTER — Ambulatory Visit
Admission: RE | Admit: 2018-09-10 | Discharge: 2018-09-10 | Disposition: A | Discharge status: Home / Self Care | Payer: Medicare Other | Source: Ambulatory Visit | Attending: Rheumatology | Admitting: Rheumatology

## 2018-09-10 ENCOUNTER — Other Ambulatory Visit: Payer: Self-pay

## 2018-09-10 DIAGNOSIS — M25561 Pain in right knee: Secondary | ICD-10-CM

## 2018-09-12 ENCOUNTER — Other Ambulatory Visit: Payer: Self-pay

## 2018-10-21 ENCOUNTER — Ambulatory Visit: Payer: Medicare Other | Admitting: Urology

## 2018-12-02 ENCOUNTER — Ambulatory Visit: Payer: Medicare Other | Admitting: Urology

## 2019-02-09 DIAGNOSIS — I10 Essential (primary) hypertension: Secondary | ICD-10-CM | POA: Diagnosis not present

## 2019-02-09 DIAGNOSIS — Z0000 Encounter for general adult medical examination without abnormal findings: Secondary | ICD-10-CM | POA: Diagnosis not present

## 2019-02-09 DIAGNOSIS — N183 Chronic kidney disease, stage 3 unspecified: Secondary | ICD-10-CM | POA: Diagnosis not present

## 2019-02-09 DIAGNOSIS — Z125 Encounter for screening for malignant neoplasm of prostate: Secondary | ICD-10-CM | POA: Diagnosis not present

## 2019-02-09 DIAGNOSIS — Z6839 Body mass index (BMI) 39.0-39.9, adult: Secondary | ICD-10-CM | POA: Diagnosis not present

## 2019-02-09 DIAGNOSIS — M1991 Primary osteoarthritis, unspecified site: Secondary | ICD-10-CM | POA: Diagnosis not present

## 2019-02-09 DIAGNOSIS — M1A00X0 Idiopathic chronic gout, unspecified site, without tophus (tophi): Secondary | ICD-10-CM | POA: Diagnosis not present

## 2019-02-09 DIAGNOSIS — Z1389 Encounter for screening for other disorder: Secondary | ICD-10-CM | POA: Diagnosis not present

## 2019-05-01 DIAGNOSIS — B029 Zoster without complications: Secondary | ICD-10-CM | POA: Diagnosis not present

## 2019-05-01 DIAGNOSIS — M1991 Primary osteoarthritis, unspecified site: Secondary | ICD-10-CM | POA: Diagnosis not present

## 2019-05-01 DIAGNOSIS — I10 Essential (primary) hypertension: Secondary | ICD-10-CM | POA: Diagnosis not present

## 2019-05-01 DIAGNOSIS — Z6841 Body Mass Index (BMI) 40.0 and over, adult: Secondary | ICD-10-CM | POA: Diagnosis not present

## 2020-02-23 DIAGNOSIS — Z0001 Encounter for general adult medical examination with abnormal findings: Secondary | ICD-10-CM | POA: Diagnosis not present

## 2020-02-23 DIAGNOSIS — Z1331 Encounter for screening for depression: Secondary | ICD-10-CM | POA: Diagnosis not present

## 2020-02-23 DIAGNOSIS — E7849 Other hyperlipidemia: Secondary | ICD-10-CM | POA: Diagnosis not present

## 2020-02-23 DIAGNOSIS — I10 Essential (primary) hypertension: Secondary | ICD-10-CM | POA: Diagnosis not present

## 2020-02-23 DIAGNOSIS — R3129 Other microscopic hematuria: Secondary | ICD-10-CM | POA: Diagnosis not present

## 2020-02-23 DIAGNOSIS — Z6837 Body mass index (BMI) 37.0-37.9, adult: Secondary | ICD-10-CM | POA: Diagnosis not present

## 2020-02-23 DIAGNOSIS — Z125 Encounter for screening for malignant neoplasm of prostate: Secondary | ICD-10-CM | POA: Diagnosis not present

## 2020-02-23 DIAGNOSIS — M1991 Primary osteoarthritis, unspecified site: Secondary | ICD-10-CM | POA: Diagnosis not present

## 2020-02-23 DIAGNOSIS — E538 Deficiency of other specified B group vitamins: Secondary | ICD-10-CM | POA: Diagnosis not present

## 2020-02-23 DIAGNOSIS — N183 Chronic kidney disease, stage 3 unspecified: Secondary | ICD-10-CM | POA: Diagnosis not present

## 2020-02-23 DIAGNOSIS — E559 Vitamin D deficiency, unspecified: Secondary | ICD-10-CM | POA: Diagnosis not present

## 2020-02-23 DIAGNOSIS — Z1389 Encounter for screening for other disorder: Secondary | ICD-10-CM | POA: Diagnosis not present

## 2020-03-12 DIAGNOSIS — Z1212 Encounter for screening for malignant neoplasm of rectum: Secondary | ICD-10-CM | POA: Diagnosis not present

## 2020-03-12 DIAGNOSIS — Z1211 Encounter for screening for malignant neoplasm of colon: Secondary | ICD-10-CM | POA: Diagnosis not present

## 2020-03-19 LAB — COLOGUARD: Cologuard: POSITIVE — AB

## 2020-03-25 ENCOUNTER — Encounter (INDEPENDENT_AMBULATORY_CARE_PROVIDER_SITE_OTHER): Payer: Self-pay | Admitting: *Deleted

## 2020-04-04 ENCOUNTER — Encounter (INDEPENDENT_AMBULATORY_CARE_PROVIDER_SITE_OTHER): Payer: Self-pay | Admitting: *Deleted

## 2020-05-07 ENCOUNTER — Ambulatory Visit (INDEPENDENT_AMBULATORY_CARE_PROVIDER_SITE_OTHER): Payer: Medicare Other | Admitting: Gastroenterology

## 2020-05-16 ENCOUNTER — Encounter (INDEPENDENT_AMBULATORY_CARE_PROVIDER_SITE_OTHER): Payer: Self-pay | Admitting: Gastroenterology

## 2020-05-16 ENCOUNTER — Other Ambulatory Visit (INDEPENDENT_AMBULATORY_CARE_PROVIDER_SITE_OTHER): Payer: Self-pay | Admitting: *Deleted

## 2020-05-16 ENCOUNTER — Encounter (INDEPENDENT_AMBULATORY_CARE_PROVIDER_SITE_OTHER): Payer: Self-pay

## 2020-05-16 ENCOUNTER — Ambulatory Visit (INDEPENDENT_AMBULATORY_CARE_PROVIDER_SITE_OTHER): Payer: Medicare HMO | Admitting: Gastroenterology

## 2020-05-16 ENCOUNTER — Telehealth (INDEPENDENT_AMBULATORY_CARE_PROVIDER_SITE_OTHER): Payer: Self-pay

## 2020-05-16 ENCOUNTER — Other Ambulatory Visit (INDEPENDENT_AMBULATORY_CARE_PROVIDER_SITE_OTHER): Payer: Self-pay

## 2020-05-16 ENCOUNTER — Other Ambulatory Visit: Payer: Self-pay

## 2020-05-16 DIAGNOSIS — R195 Other fecal abnormalities: Secondary | ICD-10-CM | POA: Diagnosis not present

## 2020-05-16 MED ORDER — PEG 3350-KCL-NA BICARB-NACL 420 G PO SOLR: 4000.0000 mL | ORAL | 0 refills | Status: DC

## 2020-05-16 NOTE — Progress Notes (Signed)
Maylon Peppers, M.D. Gastroenterology & Hepatology [facility] [address] [place], [place] [address] Primary Care Physician: Cory Munch, [place] [address]  Referring MD: PCP  Chief Complaint: Positive Cologuard  History of Present Illness: Dale Green is a 65 y.o. male with past medical history of gout, who presents for evaluation of positive Cologuard.  Patient denies having any symptoms.  He underwent Cologuard test on 03/12/2020 which came back positive.  Due to this, he was referred for evaluation by gastroenterology.  The patient denies having any nausea, vomiting, fever, chills, hematochezia, melena, hematemesis, abdominal distention, abdominal pain, diarrhea, jaundice, pruritus or weight loss.  Last TZG:YFVCB Last Colonoscopy:15 years ago, normal per patient - Performed at [facility], no report available  FHx: neg for any gastrointestinal/liver disease, no malignancies Social: neg smoking, alcohol or illicit drug use Surgical: bilateral uretheral reimplantation x2  Past Medical History: Past Medical History:  Diagnosis Date  . BPH (benign prostatic hyperplasia)   . Gout   . Osteoarthritis     Past Surgical History: Past Surgical History:  Procedure Laterality Date  . ureathral surgery      Family History: Family History  Problem Relation Age of Onset  . Healthy Mother   . Heart disease Father   . Healthy Brother     Social History: Social History   Tobacco Use  Smoking Status Former Smoker  Smokeless Tobacco Never Used   Social History   Substance and Sexual Activity  Alcohol Use No   Social History   Substance and Sexual Activity  Drug Use No    Allergies: No Known Allergies  Medications: Current Outpatient Medications  Medication Sig Dispense Refill  . allopurinol (ZYLOPRIM) 300 MG tablet Take 300 mg by mouth daily.  0  . colchicine  0.6 MG tablet Take 0.6 mg by mouth 2 (two) times daily as needed. Gout flairups  0  . traMADol (ULTRAM) 50 MG tablet Take 50 mg by mouth daily.  0   No current facility-administered medications for this visit.    Review of Systems: GENERAL: negative for malaise, night sweats HEENT: No changes in hearing or vision, no nose bleeds or other nasal problems. NECK: Negative for lumps, goiter, pain and significant neck swelling RESPIRATORY: Negative for cough, wheezing CARDIOVASCULAR: Negative for chest pain, leg swelling, palpitations, orthopnea GI: SEE HPI MUSCULOSKELETAL: Negative for joint pain or swelling, back pain, and muscle pain. SKIN: Negative for lesions, rash PSYCH: Negative for sleep disturbance, mood disorder and recent psychosocial stressors. HEMATOLOGY Negative for prolonged bleeding, bruising easily, and swollen nodes. ENDOCRINE: Negative for cold or heat intolerance, polyuria, polydipsia and goiter. NEURO: negative for tremor, gait imbalance, syncope and seizures. The remainder of the review of systems is noncontributory.   Physical Exam: BP (!) 159/92 (BP Location: Left Arm, Patient Position: Sitting, Cuff Size: Large)   Pulse 73   Temp 97.7 F (36.5 C) (Oral)   Ht 5\' 10"  (1.778 m)   Wt 254 lb (115.2 kg)   BMI 36.45 kg/m  GENERAL: The patient is AO x3, in no acute distress. Obese. HEENT: Head is normocephalic and atraumatic. EOMI are intact. Mouth is well hydrated and without lesions. NECK: Supple. No masses LUNGS: Clear to auscultation. No presence of rhonchi/wheezing/rales. Adequate chest expansion HEART: RRR, normal s1 and s2. ABDOMEN: Soft, nontender, no guarding, no peritoneal signs, and nondistended. BS +. No masses. EXTREMITIES: Without any cyanosis, clubbing, rash, lesions or edema.  NEUROLOGIC: AOx3, no focal motor deficit. SKIN: no jaundice, no rashes   Imaging/Labs: as above  I personally reviewed and interpreted the available labs, imaging and  endoscopic files.  Impression and Plan: Fardeen Steinberger is a 64 y.o. male with past medical history of gout, who presents for evaluation of positive Cologuard.  The patient has been asymptomatic.  We will need to perform a colonoscopy to evaluate positive Cologuard testing. More than 50% of the office visit was dedicated to discussing the procedure, including the day of and risks involved. Patient understands what the procedure involves including the benefits and any risks. Patient understands alternatives to the proposed procedure. Risks including (but not limited to) bleeding, tearing of the lining (perforation), rupture of adjacent organs, problems with heart and lung function, infection, and medication reactions. A small percentage of complications may require surgery, hospitalization, repeat endoscopic procedure, and/or transfusion. A small percentage of polyps and other tumors may not be seen.  Discussed cologuard test results in detail, specifically what it means when the test is positive or negative.  Discussed that there is a possibility that even when the test is positive there may not be a polyp found on colonoscopy. Patient understood and agreed.  - Schedule colonoscopy  All questions were answered.      Maylon Peppers, MD Gastroenterology and Hepatology [facility]

## 2020-05-16 NOTE — Patient Instructions (Signed)
Schedule colonoscopy

## 2020-05-16 NOTE — Telephone Encounter (Signed)
Dale Green, CMA  

## 2020-05-16 NOTE — H&P (View-Only) (Signed)
Maylon Peppers, M.D. Gastroenterology & Hepatology [facility] [address] [place], [place] [address] Primary Care Physician: Cory Munch, [place] [address]  Referring MD: PCP  Chief Complaint: Positive Cologuard  History of Present Illness: Dale Green is a 65 y.o. male with past medical history of gout, who presents for evaluation of positive Cologuard.  Patient denies having any symptoms.  He underwent Cologuard test on 03/12/2020 which came back positive.  Due to this, he was referred for evaluation by gastroenterology.  The patient denies having any nausea, vomiting, fever, chills, hematochezia, melena, hematemesis, abdominal distention, abdominal pain, diarrhea, jaundice, pruritus or weight loss.  Last TZG:YFVCB Last Colonoscopy:15 years ago, normal per patient - Performed at [facility], no report available  FHx: neg for any gastrointestinal/liver disease, no malignancies Social: neg smoking, alcohol or illicit drug use Surgical: bilateral uretheral reimplantation x2  Past Medical History: Past Medical History:  Diagnosis Date  . BPH (benign prostatic hyperplasia)   . Gout   . Osteoarthritis     Past Surgical History: Past Surgical History:  Procedure Laterality Date  . ureathral surgery      Family History: Family History  Problem Relation Age of Onset  . Healthy Mother   . Heart disease Father   . Healthy Brother     Social History: Social History   Tobacco Use  Smoking Status Former Smoker  Smokeless Tobacco Never Used   Social History   Substance and Sexual Activity  Alcohol Use No   Social History   Substance and Sexual Activity  Drug Use No    Allergies: No Known Allergies  Medications: Current Outpatient Medications  Medication Sig Dispense Refill  . allopurinol (ZYLOPRIM) 300 MG tablet Take 300 mg by mouth daily.  0  . colchicine  0.6 MG tablet Take 0.6 mg by mouth 2 (two) times daily as needed. Gout flairups  0  . traMADol (ULTRAM) 50 MG tablet Take 50 mg by mouth daily.  0   No current facility-administered medications for this visit.    Review of Systems: GENERAL: negative for malaise, night sweats HEENT: No changes in hearing or vision, no nose bleeds or other nasal problems. NECK: Negative for lumps, goiter, pain and significant neck swelling RESPIRATORY: Negative for cough, wheezing CARDIOVASCULAR: Negative for chest pain, leg swelling, palpitations, orthopnea GI: SEE HPI MUSCULOSKELETAL: Negative for joint pain or swelling, back pain, and muscle pain. SKIN: Negative for lesions, rash PSYCH: Negative for sleep disturbance, mood disorder and recent psychosocial stressors. HEMATOLOGY Negative for prolonged bleeding, bruising easily, and swollen nodes. ENDOCRINE: Negative for cold or heat intolerance, polyuria, polydipsia and goiter. NEURO: negative for tremor, gait imbalance, syncope and seizures. The remainder of the review of systems is noncontributory.   Physical Exam: BP (!) 159/92 (BP Location: Left Arm, Patient Position: Sitting, Cuff Size: Large)   Pulse 73   Temp 97.7 F (36.5 C) (Oral)   Ht 5\' 10"  (1.778 m)   Wt 254 lb (115.2 kg)   BMI 36.45 kg/m  GENERAL: The patient is AO x3, in no acute distress. Obese. HEENT: Head is normocephalic and atraumatic. EOMI are intact. Mouth is well hydrated and without lesions. NECK: Supple. No masses LUNGS: Clear to auscultation. No presence of rhonchi/wheezing/rales. Adequate chest expansion HEART: RRR, normal s1 and s2. ABDOMEN: Soft, nontender, no guarding, no peritoneal signs, and nondistended. BS +. No masses. EXTREMITIES: Without any cyanosis, clubbing, rash, lesions or edema.  NEUROLOGIC: AOx3, no focal motor deficit. SKIN: no jaundice, no rashes   Imaging/Labs: as above  I personally reviewed and interpreted the available labs, imaging and  endoscopic files.  Impression and Plan: Dale Green is a 65 y.o. male with past medical history of gout, who presents for evaluation of positive Cologuard.  The patient has been asymptomatic.  We will need to perform a colonoscopy to evaluate positive Cologuard testing. More than 50% of the office visit was dedicated to discussing the procedure, including the day of and risks involved. Patient understands what the procedure involves including the benefits and any risks. Patient understands alternatives to the proposed procedure. Risks including (but not limited to) bleeding, tearing of the lining (perforation), rupture of adjacent organs, problems with heart and lung function, infection, and medication reactions. A small percentage of complications may require surgery, hospitalization, repeat endoscopic procedure, and/or transfusion. A small percentage of polyps and other tumors may not be seen.  Discussed cologuard test results in detail, specifically what it means when the test is positive or negative.  Discussed that there is a possibility that even when the test is positive there may not be a polyp found on colonoscopy. Patient understood and agreed.  - Schedule colonoscopy  All questions were answered.      Maylon Peppers, MD Gastroenterology and Hepatology [facility]

## 2020-05-17 ENCOUNTER — Encounter (INDEPENDENT_AMBULATORY_CARE_PROVIDER_SITE_OTHER): Payer: Self-pay

## 2020-05-28 ENCOUNTER — Other Ambulatory Visit: Payer: Self-pay

## 2020-05-28 ENCOUNTER — Other Ambulatory Visit (HOSPITAL_COMMUNITY)
Admission: RE | Admit: 2020-05-28 | Discharge: 2020-05-28 | Disposition: A | Discharge status: Home / Self Care | Payer: Medicare HMO | Source: Ambulatory Visit | Attending: Gastroenterology | Admitting: Gastroenterology

## 2020-05-28 DIAGNOSIS — Z01812 Encounter for preprocedural laboratory examination: Secondary | ICD-10-CM | POA: Insufficient documentation

## 2020-05-28 DIAGNOSIS — Z20822 Contact with and (suspected) exposure to covid-19: Secondary | ICD-10-CM | POA: Diagnosis not present

## 2020-05-28 LAB — SARS CORONAVIRUS 2 (TAT 6-24 HRS): SARS Coronavirus 2: NEGATIVE

## 2020-05-29 ENCOUNTER — Encounter (HOSPITAL_COMMUNITY): Payer: Self-pay | Admitting: Gastroenterology

## 2020-05-29 ENCOUNTER — Encounter (HOSPITAL_COMMUNITY)
Admission: RE | Disposition: A | Discharge status: Home / Self Care | Payer: Self-pay | Source: Home / Self Care | Attending: Gastroenterology

## 2020-05-29 ENCOUNTER — Other Ambulatory Visit: Payer: Self-pay

## 2020-05-29 ENCOUNTER — Ambulatory Visit (HOSPITAL_COMMUNITY): Payer: Medicare HMO | Admitting: Anesthesiology

## 2020-05-29 ENCOUNTER — Ambulatory Visit (HOSPITAL_COMMUNITY)
Admission: RE | Admit: 2020-05-29 | Discharge: 2020-05-29 | Disposition: A | Discharge status: Home / Self Care | Payer: Medicare HMO | Attending: Gastroenterology | Admitting: Gastroenterology

## 2020-05-29 DIAGNOSIS — D120 Benign neoplasm of cecum: Secondary | ICD-10-CM | POA: Insufficient documentation

## 2020-05-29 DIAGNOSIS — Z87891 Personal history of nicotine dependence: Secondary | ICD-10-CM | POA: Insufficient documentation

## 2020-05-29 DIAGNOSIS — K5730 Diverticulosis of large intestine without perforation or abscess without bleeding: Secondary | ICD-10-CM | POA: Diagnosis not present

## 2020-05-29 DIAGNOSIS — D123 Benign neoplasm of transverse colon: Secondary | ICD-10-CM | POA: Diagnosis not present

## 2020-05-29 DIAGNOSIS — Z79899 Other long term (current) drug therapy: Secondary | ICD-10-CM | POA: Insufficient documentation

## 2020-05-29 DIAGNOSIS — D124 Benign neoplasm of descending colon: Secondary | ICD-10-CM | POA: Insufficient documentation

## 2020-05-29 DIAGNOSIS — R55 Syncope and collapse: Secondary | ICD-10-CM | POA: Diagnosis not present

## 2020-05-29 DIAGNOSIS — R195 Other fecal abnormalities: Secondary | ICD-10-CM | POA: Diagnosis not present

## 2020-05-29 DIAGNOSIS — D121 Benign neoplasm of appendix: Secondary | ICD-10-CM

## 2020-05-29 DIAGNOSIS — N400 Enlarged prostate without lower urinary tract symptoms: Secondary | ICD-10-CM | POA: Insufficient documentation

## 2020-05-29 DIAGNOSIS — K635 Polyp of colon: Secondary | ICD-10-CM | POA: Diagnosis not present

## 2020-05-29 HISTORY — PX: COLONOSCOPY WITH PROPOFOL: SHX5780

## 2020-05-29 HISTORY — PX: BIOPSY: SHX5522

## 2020-05-29 HISTORY — PX: POLYPECTOMY: SHX5525

## 2020-05-29 LAB — HM COLONOSCOPY

## 2020-05-29 SURGERY — COLONOSCOPY WITH PROPOFOL
Anesthesia: General

## 2020-05-29 MED ORDER — PROPOFOL 10 MG/ML IV BOLUS
INTRAVENOUS | Status: DC | PRN
  Administered 2020-05-29: 50 mg via INTRAVENOUS

## 2020-05-29 MED ORDER — LACTATED RINGERS IV SOLN: INTRAVENOUS | Status: DC

## 2020-05-29 MED ORDER — PROPOFOL 10 MG/ML IV BOLUS
INTRAVENOUS | Status: AC
  Filled 2020-05-29: qty 40

## 2020-05-29 MED ORDER — PROPOFOL 500 MG/50ML IV EMUL
INTRAVENOUS | Status: DC | PRN
  Administered 2020-05-29: 150 ug/kg/min via INTRAVENOUS

## 2020-05-29 MED ORDER — STERILE WATER FOR IRRIGATION IR SOLN
Status: DC | PRN
  Administered 2020-05-29: 100 mL

## 2020-05-29 NOTE — Anesthesia Preprocedure Evaluation (Signed)
Anesthesia Evaluation  Patient identified by MRN, date of birth, ID band Patient awake    Reviewed: Allergy & Precautions, NPO status , Patient's Chart, lab work & pertinent test results  History of Anesthesia Complications Negative for: history of anesthetic complications  Airway Mallampati: II  TM Distance: >3 FB Neck ROM: Full    Dental  (+) Dental Advisory Given, Teeth Intact   Pulmonary former smoker,    Pulmonary exam normal breath sounds clear to auscultation       Cardiovascular Exercise Tolerance: Good Normal cardiovascular exam Rhythm:Regular Rate:Normal     Neuro/Psych negative neurological ROS  negative psych ROS   GI/Hepatic negative GI ROS, Neg liver ROS,   Endo/Other  negative endocrine ROS  Renal/GU Renal InsufficiencyRenal disease     Musculoskeletal  (+) Arthritis  (gout), Osteoarthritis,    Abdominal   Peds  Hematology negative hematology ROS (+)   Anesthesia Other Findings Syncope   Reproductive/Obstetrics                             Anesthesia Physical Anesthesia Plan  ASA: II  Anesthesia Plan: General   Post-op Pain Management:    Induction: Intravenous  PONV Risk Score and Plan: Propofol infusion  Airway Management Planned: Nasal Cannula and Natural Airway  Additional Equipment:   Intra-op Plan:   Post-operative Plan:   Informed Consent: I have reviewed the patients History and Physical, chart, labs and discussed the procedure including the risks, benefits and alternatives for the proposed anesthesia with the patient or authorized representative who has indicated his/her understanding and acceptance.     Dental advisory given  Plan Discussed with: CRNA and Surgeon  Anesthesia Plan Comments:         Anesthesia Quick Evaluation

## 2020-05-29 NOTE — Transfer of Care (Signed)
Immediate Anesthesia Transfer of Care Note  Patient: Dale Green  Procedure(s) Performed: COLONOSCOPY WITH PROPOFOL (N/A ) POLYPECTOMY BIOPSY  Patient Location: Endoscopy Unit  Anesthesia Type:General  Level of Consciousness: awake  Airway & Oxygen Therapy: Patient Spontanous Breathing  Post-op Assessment: Report given to RN  Post vital signs: Reviewed and stable  Last Vitals:  Vitals Value Taken Time  BP    Temp    Pulse    Resp    SpO2      Last Pain:  Vitals:   05/29/20 1035  TempSrc:   PainSc: 0-No pain      Patients Stated Pain Goal: 0 (21/30/86 5784)  Complications: No complications documented.

## 2020-05-29 NOTE — Discharge Instructions (Signed)
You are being discharged to home.  Resume your previous diet.  We are waiting for your pathology results.  Your physician has recommended a repeat colonoscopy for surveillance based on pathology results.    Colonoscopy, Adult, Care After This sheet gives you information about how to care for yourself after your procedure. Your doctor may also give you more specific instructions. If you have problems or questions, call your doctor. What can I expect after the procedure? After the procedure, it is common to have:  A small amount of blood in your poop (stool) for 24 hours.  Some gas.  Mild cramping or bloating in your belly (abdomen). Follow these instructions at home: Eating and drinking  Drink enough fluid to keep your pee (urine) pale yellow.  Follow instructions from your doctor about what you cannot eat or drink.  Return to your normal diet as told by your doctor. Avoid heavy or fried foods that are hard to digest.   Activity  Rest as told by your doctor.  Do not sit for a long time without moving. Get up to take short walks every 1-2 hours. This is important. Ask for help if you feel weak or unsteady.  Return to your normal activities as told by your doctor. Ask your doctor what activities are safe for you. To help cramping and bloating:  Try walking around.  Put heat on your belly as told by your doctor. Use the heat source that your doctor recommends, such as a moist heat pack or a heating pad. ? Put a towel between your skin and the heat source. ? Leave the heat on for 20-30 minutes. ? Remove the heat if your skin turns bright red. This is very important if you are unable to feel pain, heat, or cold. You may have a greater risk of getting burned.   General instructions  If you were given a medicine to help you relax (sedative) during your procedure, it can affect you for many hours. Do not drive or use machinery until your doctor says that it is safe.  For the first  24 hours after the procedure: ? Do not sign important documents. ? Do not drink alcohol. ? Do your daily activities more slowly than normal. ? Eat foods that are soft and easy to digest.  Take over-the-counter or prescription medicines only as told by your doctor.  Keep all follow-up visits as told by your doctor. This is important. Contact a doctor if:  You have blood in your poop 2-3 days after the procedure. Get help right away if:  You have more than a small amount of blood in your poop.  You see large clumps of tissue (blood clots) in your poop.  Your belly is swollen.  You feel like you may vomit (nauseous).  You vomit.  You have a fever.  You have belly pain that gets worse, and medicine does not help your pain. Summary  After the procedure, it is common to have a small amount of blood in your poop. You may also have mild cramping and bloating in your belly.  If you were given a medicine to help you relax (sedative) during your procedure, it can affect you for many hours. Do not drive or use machinery until your doctor says that it is safe.  Get help right away if you have a lot of blood in your poop, feel like you may vomit, have a fever, or have more belly pain. This information is not  intended to replace advice given to you by your health care provider. Make sure you discuss any questions you have with your health care provider. Document Revised: 11/11/2018 Document Reviewed: 08/01/2018 Elsevier Patient Education  [place].

## 2020-05-29 NOTE — Interval H&P Note (Signed)
History and Physical Interval Note:  05/29/2020 9:49 AM Dale Green is a 65 y.o. male with past medical history of gout, who presents for evaluation of positive Cologuard.  Patient denies any complaints. The patient denies having any nausea, vomiting, fever, chills, hematochezia, melena, hematemesis, abdominal distention, abdominal pain, diarrhea, jaundice, pruritus or weight loss.  GENERAL: The patient is AO x3, in no acute distress. Obese  HEENT: Head is normocephalic and atraumatic. EOMI are intact. Mouth is well hydrated and without lesions. NECK: Supple. No masses LUNGS: Clear to auscultation. No presence of rhonchi/wheezing/rales. Adequate chest expansion HEART: RRR, normal s1 and s2. ABDOMEN: Soft, nontender, no guarding, no peritoneal signs, and nondistended. BS +. No masses. EXTREMITIES: Without any cyanosis, clubbing, rash, lesions or edema. NEUROLOGIC: AOx3, no focal motor deficit. SKIN: no jaundice, no rashes   [name]  has presented today for surgery, with the diagnosis of Positive Cologuard.  The various methods of treatment have been discussed with the patient and family. After consideration of risks, benefits and other options for treatment, the patient has consented to  Procedure(s) with comments: COLONOSCOPY WITH PROPOFOL (N/A) - 11:00 as a surgical intervention.  The patient's history has been reviewed, patient examined, no change in status, stable for surgery.  I have reviewed the patient's chart and labs.  Questions were answered to the patient's satisfaction.     [name]

## 2020-05-29 NOTE — Op Note (Signed)
[facility] Patient Name: Dale Green Procedure Date: 05/29/2020 10:22 AM MRN: [id number] Date of Birth: [birth year] Attending MD: Maylon Peppers ,  CSN: [id number] Age: 65 Admit Type: Outpatient Procedure:                Colonoscopy Indications:              Positive Cologuard test Providers:                Maylon Peppers, Lambert Mody, Kristine L.                            Risa Grill, Technician Referring MD:              Medicines:                Monitored Anesthesia Care Complications:            No immediate complications. Estimated Blood Loss:     Estimated blood loss: none. Procedure:                Pre-Anesthesia Assessment:                           - Prior to the procedure, a History and Physical                            was performed, and patient medications, allergies                            and sensitivities were reviewed. The patient's                            tolerance of previous anesthesia was reviewed.                           - The risks and benefits of the procedure and the                            sedation options and risks were discussed with the                            patient. All questions were answered and informed                            consent was obtained.                           - ASA Grade Assessment: II - A patient with mild                            systemic disease.                           After obtaining informed consent, the colonoscope                            was passed under direct vision. Throughout the  procedure, the patient's blood pressure, pulse, and                            oxygen saturations were monitored continuously. The                            [id number]) was introduced through the anus                            and advanced to the the cecum, identified by                            appendiceal orifice and ileocecal valve. The                             colonoscopy was performed without difficulty. The                            patient tolerated the procedure well. The quality                            of the bowel preparation was excellent. Scope In: 10:40:04 AM Scope Out: 11:01:26 AM Scope Withdrawal Time: 0 hours 15 minutes 49 seconds  Total Procedure Duration: 0 hours 21 minutes 22 seconds  Findings:      The perianal and digital rectal examinations were normal.      A few large-mouthed diverticula were found in the ascending colon.      A 2 mm polyp was found in the cecum. The polyp was sessile. The polyp       was removed with a cold biopsy forceps. Resection and retrieval were       complete.      Two sessile polyps were found in the descending colon and transverse       colon. The polyps were 6 mm in size. These polyps were removed with a       cold snare. Resection and retrieval were complete.      The retroflexed view of the distal rectum and anal verge was normal and       showed no anal or rectal abnormalities. Impression:               - Diverticulosis in the ascending colon.                           - One 2 mm polyp in the cecum, removed with a cold                            biopsy forceps. Resected and retrieved.                           - Two 6 mm polyps in the descending colon and in                            the transverse colon, removed with a cold snare.  Resected and retrieved.                           - The distal rectum and anal verge are normal on                            retroflexion view. Moderate Sedation:      Per Anesthesia Care Recommendation:           - Discharge patient to home (ambulatory).                           - Resume previous diet.                           - Await pathology results.                           - Repeat colonoscopy for surveillance based on                            pathology results. Procedure Code(s):        --- Professional ---                            [phone], Colonoscopy, flexible; with removal of                            tumor(s), polyp(s), or other lesion(s) by snare                            technique                           [id number], 31, Colonoscopy, flexible; with biopsy,                            single or multiple Diagnosis Code(s):        --- Professional ---                           K63.5, Polyp of colon                           R19.5, Other fecal abnormalities                           K57.30, Diverticulosis of large intestine without                            perforation or abscess without bleeding CPT copyright 2019 American Medical Association. All rights reserved. The codes documented in this report are preliminary and upon coder review may  be revised to meet current compliance requirements. Maylon Peppers, MD Maylon Peppers,  05/29/2020 11:07:41 AM This report has been signed electronically. Number of Addenda: 0

## 2020-05-29 NOTE — Anesthesia Postprocedure Evaluation (Signed)
Anesthesia Post Note  Patient: Dale Green  Procedure(s) Performed: COLONOSCOPY WITH PROPOFOL (N/A ) POLYPECTOMY BIOPSY  Patient location during evaluation: Endoscopy Anesthesia Type: General Level of consciousness: awake and alert Pain management: pain level controlled Vital Signs Assessment: post-procedure vital signs reviewed and stable Respiratory status: spontaneous breathing Cardiovascular status: blood pressure returned to baseline and stable Postop Assessment: no apparent nausea or vomiting Anesthetic complications: no   No complications documented.   Last Vitals:  Vitals:   05/29/20 0946 05/29/20 1110  BP: (!) 150/80   Pulse: 74 68  Resp: 16 12  Temp: 36.6 C (!) 36.4 C  SpO2: 98% 98%    Last Pain:  Vitals:   05/29/20 1110  TempSrc: Oral  PainSc: 0-No pain                 Kosisochukwu Goldberg

## 2020-05-30 LAB — SURGICAL PATHOLOGY

## 2020-06-04 ENCOUNTER — Encounter (HOSPITAL_COMMUNITY): Payer: Self-pay | Admitting: Gastroenterology

## 2020-06-11 ENCOUNTER — Encounter (INDEPENDENT_AMBULATORY_CARE_PROVIDER_SITE_OTHER): Payer: Self-pay | Admitting: *Deleted

## 2020-07-15 ENCOUNTER — Ambulatory Visit (INDEPENDENT_AMBULATORY_CARE_PROVIDER_SITE_OTHER): Payer: Medicare HMO | Admitting: Gastroenterology

## 2020-11-01 DIAGNOSIS — S0990XA Unspecified injury of head, initial encounter: Secondary | ICD-10-CM | POA: Diagnosis not present

## 2020-11-01 DIAGNOSIS — H60509 Unspecified acute noninfective otitis externa, unspecified ear: Secondary | ICD-10-CM | POA: Diagnosis not present

## 2020-11-01 DIAGNOSIS — R519 Headache, unspecified: Secondary | ICD-10-CM | POA: Diagnosis not present

## 2020-11-01 DIAGNOSIS — I10 Essential (primary) hypertension: Secondary | ICD-10-CM | POA: Diagnosis not present

## 2020-11-01 DIAGNOSIS — R5383 Other fatigue: Secondary | ICD-10-CM | POA: Diagnosis not present

## 2020-11-01 DIAGNOSIS — M1A00X0 Idiopathic chronic gout, unspecified site, without tophus (tophi): Secondary | ICD-10-CM | POA: Diagnosis not present

## 2020-11-01 DIAGNOSIS — E559 Vitamin D deficiency, unspecified: Secondary | ICD-10-CM | POA: Diagnosis not present

## 2020-11-01 DIAGNOSIS — E538 Deficiency of other specified B group vitamins: Secondary | ICD-10-CM | POA: Diagnosis not present

## 2020-11-01 DIAGNOSIS — M26629 Arthralgia of temporomandibular joint, unspecified side: Secondary | ICD-10-CM | POA: Diagnosis not present

## 2020-11-01 DIAGNOSIS — S2239XA Fracture of one rib, unspecified side, initial encounter for closed fracture: Secondary | ICD-10-CM | POA: Diagnosis not present

## 2020-11-01 DIAGNOSIS — Z6835 Body mass index (BMI) 35.0-35.9, adult: Secondary | ICD-10-CM | POA: Diagnosis not present

## 2021-07-10 ENCOUNTER — Other Ambulatory Visit (HOSPITAL_COMMUNITY): Payer: Self-pay | Admitting: Internal Medicine

## 2021-07-10 ENCOUNTER — Other Ambulatory Visit: Payer: Self-pay | Admitting: Internal Medicine

## 2021-07-10 ENCOUNTER — Ambulatory Visit (HOSPITAL_COMMUNITY)
Admission: RE | Admit: 2021-07-10 | Discharge: 2021-07-10 | Disposition: A | Discharge status: Home / Self Care | Payer: Medicare HMO | Source: Ambulatory Visit | Attending: Internal Medicine | Admitting: Internal Medicine

## 2021-07-10 DIAGNOSIS — G44309 Post-traumatic headache, unspecified, not intractable: Secondary | ICD-10-CM | POA: Insufficient documentation

## 2021-07-10 DIAGNOSIS — S0990XA Unspecified injury of head, initial encounter: Secondary | ICD-10-CM

## 2021-07-10 DIAGNOSIS — R519 Headache, unspecified: Secondary | ICD-10-CM | POA: Diagnosis not present

## 2021-07-28 DIAGNOSIS — N183 Chronic kidney disease, stage 3 unspecified: Secondary | ICD-10-CM | POA: Diagnosis not present

## 2021-07-28 DIAGNOSIS — E063 Autoimmune thyroiditis: Secondary | ICD-10-CM | POA: Diagnosis not present

## 2021-07-28 DIAGNOSIS — D518 Other vitamin B12 deficiency anemias: Secondary | ICD-10-CM | POA: Diagnosis not present

## 2021-07-28 DIAGNOSIS — M1991 Primary osteoarthritis, unspecified site: Secondary | ICD-10-CM | POA: Diagnosis not present

## 2021-07-28 DIAGNOSIS — E559 Vitamin D deficiency, unspecified: Secondary | ICD-10-CM | POA: Diagnosis not present

## 2021-07-28 DIAGNOSIS — Z1331 Encounter for screening for depression: Secondary | ICD-10-CM | POA: Diagnosis not present

## 2021-07-28 DIAGNOSIS — Z0001 Encounter for general adult medical examination with abnormal findings: Secondary | ICD-10-CM | POA: Diagnosis not present

## 2021-07-28 DIAGNOSIS — Z125 Encounter for screening for malignant neoplasm of prostate: Secondary | ICD-10-CM | POA: Diagnosis not present

## 2021-07-28 DIAGNOSIS — G629 Polyneuropathy, unspecified: Secondary | ICD-10-CM | POA: Diagnosis not present

## 2021-07-28 DIAGNOSIS — I10 Essential (primary) hypertension: Secondary | ICD-10-CM | POA: Diagnosis not present

## 2021-07-28 DIAGNOSIS — R519 Headache, unspecified: Secondary | ICD-10-CM | POA: Diagnosis not present

## 2021-08-28 ENCOUNTER — Encounter: Payer: Self-pay | Admitting: Urology

## 2021-08-28 ENCOUNTER — Ambulatory Visit (INDEPENDENT_AMBULATORY_CARE_PROVIDER_SITE_OTHER): Payer: Medicare HMO | Admitting: Urology

## 2021-08-28 VITALS — BP 144/76 | HR 77

## 2021-08-28 DIAGNOSIS — R3915 Urgency of urination: Secondary | ICD-10-CM

## 2021-08-28 DIAGNOSIS — R3912 Poor urinary stream: Secondary | ICD-10-CM

## 2021-08-28 DIAGNOSIS — R972 Elevated prostate specific antigen [PSA]: Secondary | ICD-10-CM

## 2021-08-28 DIAGNOSIS — N403 Nodular prostate with lower urinary tract symptoms: Secondary | ICD-10-CM

## 2021-08-28 DIAGNOSIS — N481 Balanitis: Secondary | ICD-10-CM | POA: Diagnosis not present

## 2021-08-28 LAB — URINALYSIS, ROUTINE W REFLEX MICROSCOPIC
Bilirubin, UA: NEGATIVE
Glucose, UA: NEGATIVE
Ketones, UA: NEGATIVE
Leukocytes,UA: NEGATIVE
Nitrite, UA: NEGATIVE
Specific Gravity, UA: 1.02 (ref 1.005–1.030)
Urobilinogen, Ur: 0.2 mg/dL (ref 0.2–1.0)
pH, UA: 5 (ref 5.0–7.5)

## 2021-08-28 LAB — MICROSCOPIC EXAMINATION
Renal Epithel, UA: NONE SEEN /hpf
WBC, UA: NONE SEEN /hpf (ref 0–5)

## 2021-08-28 MED ORDER — CLOTRIMAZOLE-BETAMETHASONE 1-0.05 % EX CREA: 1.0000 | TOPICAL_CREAM | Freq: Two times a day (BID) | CUTANEOUS | 0 refills | Status: DC

## 2021-08-28 NOTE — Progress Notes (Signed)
Subjective: 1. Elevated PSA   2. Nodular prostate with lower urinary tract symptoms   3. Weak urinary stream   4. Urgency of urination   5. Balanitis      Consult requested by Dr. Redmond School.  [name] is a 66 yo male who is sent for evaluation of an elevated PSA of 4.37 on 07/31/21.  He has BPH with BOO and is on tamsulosin.  His IPSS is 9.  He has urgency and a reduced stream.   He reports his PSA has been borderline high for some time but I don't have the results.  He has had a prior negative prostate biopsy in 2019 with [facility] for a PSA of 8.5 and a nodule.   He has had no UTI's or stones.  He has had no other GU procedures.    IPSS     Row Name 08/28/21 0900         International Prostate Symptom Score   How often have you had the sensation of not emptying your bladder? Not at All     How often have you had to urinate less than every two hours? Not at All     How often have you found you stopped and started again several times when you urinated? Not at All     How often have you found it difficult to postpone urination? About half the time     How often have you had a weak urinary stream? More than half the time     How often have you had to strain to start urination? Not at All     How many times did you typically get up at night to urinate? 2 Times     Total IPSS Score 9       Quality of Life due to urinary symptoms   If you were to spend the rest of your life with your urinary condition just the way it is now how would you feel about that? Mixed              ROS:  Review of Systems  All other systems reviewed and are negative.   No Known Allergies  Past Medical History:  Diagnosis Date   BPH (benign prostatic hyperplasia)    Gout    Osteoarthritis     Past Surgical History:  Procedure Laterality Date   BIOPSY  05/29/2020   Procedure: BIOPSY;  Surgeon: Harvel Quale, MD;  [facility];  Service: Gastroenterology;;    COLONOSCOPY WITH PROPOFOL N/A 05/29/2020   Procedure: COLONOSCOPY WITH PROPOFOL;  Surgeon: Harvel Quale, MD;  [facility];  Service: Gastroenterology;  Laterality: N/A;  11:00   POLYPECTOMY  05/29/2020   Procedure: POLYPECTOMY;  Surgeon: Montez Morita, Quillian Quince, MD;  [facility];  Service: Gastroenterology;;   ureathral surgery      Social History   Socioeconomic History   Marital status: Married    Spouse name: Not on file   Number of children: Not on file   Years of education: Not on file   Highest education level: Not on file  Occupational History   Not on file  Tobacco Use   Smoking status: Former   Smokeless tobacco: Never  Vaping Use   Vaping Use: Never used  Substance and Sexual Activity   Alcohol use: No   Drug use: No   Sexual activity: Not on file  Other Topics Concern   Not on  file  Social History Narrative   Not on file   Social Determinants of Health   Financial Resource Strain: Not on file  Food Insecurity: Not on file  Transportation Needs: Not on file  Physical Activity: Not on file  Stress: Not on file  Social Connections: Not on file  Intimate Partner Violence: Not on file    Family History  Problem Relation Age of Onset   Healthy Mother    Heart disease Father    Healthy Brother     Anti-infectives: Anti-infectives (From admission, onward)    None       Current Outpatient Medications  Medication Sig Dispense Refill   allopurinol (ZYLOPRIM) 300 MG tablet Take 300 mg by mouth daily.  0   gabapentin (NEURONTIN) 100 MG capsule Take 100 mg by mouth at bedtime.     tamsulosin (FLOMAX) 0.4 MG CAPS capsule Take 0.4 mg by mouth daily.     Cholecalciferol (VITAMIN D3) 125 MCG (5000 UT) CAPS Take 5,000 Units by mouth daily. (Patient not taking: Reported on 08/28/2021)     clotrimazole-betamethasone (LOTRISONE) cream Apply 1 Application topically 2 (two) times daily. (Patient not taking: Reported on  08/28/2021) 30 g 0   Methylsulfonylmethane (MSM) POWD Take 4 g by mouth at bedtime. (Patient not taking: Reported on 08/28/2021)     traMADol (ULTRAM) 50 MG tablet Take 50 mg by mouth daily as needed for severe pain. (Patient not taking: Reported on 08/28/2021)  0   No current facility-administered medications for this visit.     Objective: Vital signs in last 24 hours: BP (!) 144/76   Pulse 77   Intake/Output from previous day: No intake/output data recorded. Intake/Output this shift: '@IOTHISSHIFT'$ @   Physical Exam Vitals reviewed.  Constitutional:      Appearance: Normal appearance. He is obese.  Abdominal:     Palpations: Abdomen is soft.     Hernia: No hernia is present.  Genitourinary:    Comments: Uncirc phallus with some glanular erythema and a normal meatus Scrotum, testes and epdidymis normal. AP/NST without lesions. Prostate 2+ with some induration of the right apex. SV non-palpable.  Neurological:     Mental Status: He is alert.     Lab Results:  No results found for this or any previous visit (from the past 24 hour(s)).  BMET No results for input(s): "NA", "K", "CL", "CO2", "GLUCOSE", "BUN", "CREATININE", "CALCIUM" in the last 72 hours. PT/INR No results for input(s): "LABPROT", "INR" in the last 72 hours. ABG No results for input(s): "PHART", "HCO3" in the last 72 hours.  Invalid input(s): "PCO2", "PO2" UA 3+ protein with hyaline casts but o/w unremarkable.   Outside labs reviewed.  Cr was 1.47 on 07/31/21.  Studies/Results: No results found. Prior prostate [place] and biopsy result reviewed.  His prostate was 45m.   Assessment/Plan: Nodular Prostate with LUTS.  I discussed options and will have him double the tamsulosin and try to avoid dietary irritants.    Elevated PSA.   His PSA is lower than before.  He has persistent induration of the apex, right > left.   I will get an ExoDx test and if low, he will return in 6 months for a PSA and exam.  If >20, I  will get and MRIP and consider a biopsy.   Balanitis.  I will put him on lotrisone cream.   Meds ordered this encounter  Medications   clotrimazole-betamethasone (LOTRISONE) cream    Sig: Apply 1 Application topically 2 (  two) times daily.    Dispense:  30 g    Refill:  0     Orders Placed This Encounter  Procedures   Microscopic Examination   Urinalysis, Routine w reflex microscopic   PSA, total and free    Standing Status:   Future    Standing Expiration Date:   08/29/2022     Return in about 6 months (around 02/28/2022) for with PSA.    CC: Dr. Redmond School.      Irine Seal 08/29/2021 [phone]

## 2021-09-04 ENCOUNTER — Other Ambulatory Visit: Payer: Self-pay | Admitting: Urology

## 2022-02-18 ENCOUNTER — Other Ambulatory Visit: Payer: Medicare HMO

## 2022-02-18 ENCOUNTER — Other Ambulatory Visit: Payer: Self-pay

## 2022-02-18 DIAGNOSIS — R972 Elevated prostate specific antigen [PSA]: Secondary | ICD-10-CM

## 2022-02-18 DIAGNOSIS — N403 Nodular prostate with lower urinary tract symptoms: Secondary | ICD-10-CM | POA: Diagnosis not present

## 2022-02-19 LAB — PSA, TOTAL AND FREE
PSA, Free Pct: 37.2 %
PSA, Free: 8.75 ng/mL
Prostate Specific Ag, Serum: 23.5 ng/mL — ABNORMAL HIGH (ref 0.0–4.0)

## 2022-02-26 ENCOUNTER — Encounter: Payer: Self-pay | Admitting: Urology

## 2022-02-26 ENCOUNTER — Ambulatory Visit (INDEPENDENT_AMBULATORY_CARE_PROVIDER_SITE_OTHER): Payer: Medicare HMO | Admitting: Urology

## 2022-02-26 VITALS — BP 156/83 | HR 76 | Ht 70.0 in | Wt 254.0 lb

## 2022-02-26 DIAGNOSIS — R3915 Urgency of urination: Secondary | ICD-10-CM

## 2022-02-26 DIAGNOSIS — N471 Phimosis: Secondary | ICD-10-CM

## 2022-02-26 DIAGNOSIS — N403 Nodular prostate with lower urinary tract symptoms: Secondary | ICD-10-CM

## 2022-02-26 DIAGNOSIS — R3912 Poor urinary stream: Secondary | ICD-10-CM

## 2022-02-26 DIAGNOSIS — R3129 Other microscopic hematuria: Secondary | ICD-10-CM | POA: Diagnosis not present

## 2022-02-26 DIAGNOSIS — R972 Elevated prostate specific antigen [PSA]: Secondary | ICD-10-CM

## 2022-02-26 LAB — MICROSCOPIC EXAMINATION: Bacteria, UA: NONE SEEN

## 2022-02-26 LAB — URINALYSIS, ROUTINE W REFLEX MICROSCOPIC
Glucose, UA: NEGATIVE
Ketones, UA: NEGATIVE
Leukocytes,UA: NEGATIVE
Nitrite, UA: NEGATIVE
RBC, UA: NEGATIVE
Specific Gravity, UA: 1.02 (ref 1.005–1.030)
Urobilinogen, Ur: 0.2 mg/dL (ref 0.2–1.0)
pH, UA: 5.5 (ref 5.0–7.5)

## 2022-02-26 MED ORDER — LEVOFLOXACIN 750 MG PO TABS: ORAL_TABLET | ORAL | 0 refills | Status: DC

## 2022-02-26 NOTE — Progress Notes (Signed)
Subjective: 1. Elevated PSA   2. Nodular prostate with lower urinary tract symptoms   3. Weak urinary stream   4. Urgency of urination   5. Microhematuria      Consult requested by Dr. Redmond School.  02/26/22: Dale Green returns today in f/u with a PSA for f/u of his history of an elevated PSA and prostate induration with a negative prior biopsy.  HIs PSA is up markedly to 23.5 on 1/31 from 4.37 on 07/31/21.  He has not had COVID or UTI symptoms. His IPSS is 12 with some urgency and a reduced stream.  We increased the tamsulosin to 0.8 at his last visit but he didn't notice much benefit. He has a bit more intermittency.  His UA has 3-10 RBC's.  He had an ExoDx of 30.   08/28/21: Dale Green is a 67 yo male who is sent for evaluation of an elevated PSA of 4.37 on 07/31/21.  He has BPH with BOO and is on tamsulosin.  His IPSS is 9.  He has urgency and a reduced stream.   He reports his PSA has been borderline high for some time but I don't have the results.  He has had a prior negative prostate biopsy in 2019 with [facility] for a PSA of 8.5 and a nodule.   He has had no UTI's or stones.  He has had no other GU procedures.      IPSS     Row Name 02/26/22 1200         International Prostate Symptom Score   How often have you had the sensation of not emptying your bladder? Not at All     How often have you had to urinate less than every two hours? Not at All     How often have you found you stopped and started again several times when you urinated? Less than half the time     How often have you found it difficult to postpone urination? More than half the time     How often have you had a weak urinary stream? More than half the time     How often have you had to strain to start urination? Not at All     How many times did you typically get up at night to urinate? 2 Times     Total IPSS Score 12       Quality of Life due to urinary symptoms   If you were to spend the rest of your  life with your urinary condition just the way it is now how would you feel about that? Mostly Satisfied                ROS:  Review of Systems  Constitutional:  Negative for weight loss.  All other systems reviewed and are negative.   No Known Allergies  Past Medical History:  Diagnosis Date   BPH (benign prostatic hyperplasia)    Gout    Osteoarthritis     Past Surgical History:  Procedure Laterality Date   BIOPSY  05/29/2020   Procedure: BIOPSY;  Surgeon: Harvel Quale, MD;  [facility];  Service: Gastroenterology;;   COLONOSCOPY WITH PROPOFOL N/A 05/29/2020   Procedure: COLONOSCOPY WITH PROPOFOL;  Surgeon: Harvel Quale, MD;  [facility];  Service: Gastroenterology;  Laterality: N/A;  11:00   POLYPECTOMY  05/29/2020   Procedure: POLYPECTOMY;  Surgeon: Harvel Quale, MD;  [facility];  Service: Gastroenterology;;   ureathral surgery      Social History   Socioeconomic History   Marital status: Married    Spouse name: Not on file   Number of children: Not on file   Years of education: Not on file   Highest education level: Not on file  Occupational History   Not on file  Tobacco Use   Smoking status: Former   Smokeless tobacco: Never  Vaping Use   Vaping Use: Never used  Substance and Sexual Activity   Alcohol use: No   Drug use: No   Sexual activity: Not on file  Other Topics Concern   Not on file  Social History Narrative   Not on file   Social Determinants of Health   Financial Resource Strain: Not on file  Food Insecurity: Not on file  Transportation Needs: Not on file  Physical Activity: Not on file  Stress: Not on file  Social Connections: Not on file  Intimate Partner Violence: Not on file    Family History  Problem Relation Age of Onset   Healthy Mother    Heart disease Father    Healthy Brother     Anti-infectives: Anti-infectives (From admission, onward)     Start     Dose/Rate Route Frequency Ordered Stop   02/26/22 0000  levofloxacin (LEVAQUIN) 750 MG tablet           02/26/22 1206         Current Outpatient Medications  Medication Sig Dispense Refill   allopurinol (ZYLOPRIM) 300 MG tablet Take 300 mg by mouth daily.  0   Cholecalciferol (VITAMIN D3) 125 MCG (5000 UT) CAPS Take 5,000 Units by mouth daily.     clotrimazole-betamethasone (LOTRISONE) cream Apply 1 Application topically 2 (two) times daily. 30 g 0   gabapentin (NEURONTIN) 100 MG capsule Take 100 mg by mouth at bedtime.     levofloxacin (LEVAQUIN) 750 MG tablet Take 1 hour prior to the procedure. 1 tablet 0   Methylsulfonylmethane (MSM) POWD Take 4 g by mouth at bedtime.     tamsulosin (FLOMAX) 0.4 MG CAPS capsule Take 0.4 mg by mouth daily.     traMADol (ULTRAM) 50 MG tablet Take 50 mg by mouth daily as needed for severe pain.  0   No current facility-administered medications for this visit.     Objective: Vital signs in last 24 hours: BP (!) 156/83   Pulse 76   Ht 5' 10"$  (1.778 m)   Wt 254 lb (115.2 kg)   BMI 36.45 kg/m   Intake/Output from previous day: No intake/output data recorded. Intake/Output this shift: @IOTHISSHIFT$ @   Physical Exam Vitals reviewed.  Constitutional:      Appearance: Normal appearance. He is obese.  Abdominal:     Palpations: Abdomen is soft.     Hernia: No hernia is present.  Genitourinary:    Comments: Uncirc phallus with some glanular erythema and a normal meatus Scrotum, testes and epdidymis normal. AP/NST without lesions. Prostate 2+ with some induration of the right apex and possibly the left apex. SV non-palpable.  Neurological:     Mental Status: He is alert.     Lab Results:  Results for orders placed or performed in visit on 02/26/22 (from the past 24 hour(s))  Urinalysis, Routine w reflex microscopic     Status: Abnormal   Collection Time: 02/26/22 11:50 AM  Result Value Ref Range   Specific Gravity, UA 1.020  1.005 - 1.030  pH, UA 5.5 5.0 - 7.5   Color, UA Yellow Yellow   Appearance Ur Clear Clear   Leukocytes,UA Negative Negative   Protein,UA 3+ (A) Negative/Trace   Glucose, UA Negative Negative   Ketones, UA Negative Negative   RBC, UA Negative Negative   Bilirubin, UA Trace (A) Negative   Urobilinogen, Ur 0.2 0.2 - 1.0 mg/dL   Nitrite, UA Negative Negative   Microscopic Examination See below:    Narrative   Performed at:  [facility] [address] [place], [place]  [id number] Lab Director: Mina Marble MT, Phone:  [phone]  Microscopic Examination     Status: Abnormal   Collection Time: 02/26/22 11:50 AM   Urine  Result Value Ref Range   WBC, UA 0-5 0 - 5 /hpf   RBC, Urine 3-10 (A) 0 - 2 /hpf   Epithelial Cells (non renal) 0-10 0 - 10 /hpf   Casts Present (A) None seen /lpf   Cast Type Hyaline casts N/A   Bacteria, UA None seen None seen/Few   Narrative   Performed at:  [facility] [address] [place], [place]  [id number] Lab Director: Mina Marble MT, Phone:  [phone]  PSA, total and free     Status: Abnormal   Collection Time: 02/26/22 12:22 PM  Result Value Ref Range   Prostate Specific Ag, Serum 9.6 (H) 0.0 - 4.0 ng/mL   PSA, Free 3.20 N/A ng/mL   PSA, Free Pct 33.3 %   Narrative   Performed at:  [address], [place], [place]  [id number] Lab Director: Rush Farmer MD, Phone:  [phone]    BMET No results for input(s): "NA", "K", "CL", "CO2", "GLUCOSE", "BUN", "CREATININE", "CALCIUM" in the last 72 hours. PT/INR No results for input(s): "LABPROT", "INR" in the last 72 hours. ABG No results for input(s): "PHART", "HCO3" in the last 72 hours.  Invalid input(s): "PCO2", "PO2" UA 3+ protein with hyaline casts but o/w unremarkable.   Outside labs reviewed.  Cr was 1.47 on 07/31/21.  Studies/Results: No results found. Prior prostate [place] and biopsy result reviewed.  His prostate was 24m.    Assessment/Plan: Nodular Prostate with LUTS.  He didn't improve with the increased tamsulosin.  He has some increased intermittency.      Elevated PSA.  His PSA is up markedly with a minimal change in the exam.  I will repeat the PSA today to make sure that the result is not erroneous but I will go ahead and get him scheduled for a prostate [place] biopsy.  I have reviewed the risks of bleeding, infection and voiding difficulty.    Microhematuria.  He has 3-10 RBC's on UA today.   I will address that based on the prostate biopsy results and need for staging.   Balanitis. There is less irritation on exam today.   Meds ordered this encounter  Medications   levofloxacin (LEVAQUIN) 750 MG tablet    Sig: Take 1 hour prior to the procedure.    Dispense:  1 tablet    Refill:  0     Orders Placed This Encounter  Procedures   Microscopic Examination   [place] BIOPSY MULTIPLE    Standing Status:   Future    Standing Expiration Date:   08/27/2022    Order Specific Question:   Reason for Exam (SYMPTOM  OR DIAGNOSIS REQUIRED)    Answer:   elevated PSA    Order Specific  Question:   Preferred location?    Answer:   [facility]   Urinalysis, Routine w reflex microscopic   PSA, total and free     Return for Needs prostate biopsy in the next week or two.   F/u with results. .    CC: Dr. Redmond School.      Irine Seal 02/27/2022 [phone]

## 2022-02-26 NOTE — Patient Instructions (Signed)
   Appointment NVBT:[id number] Appointment Date:03/12/2022  Location: [facility]   Prostate Biopsy Instructions  Stop all aspirin or blood thinners (aspirin, plavix, coumadin, warfarin, motrin, ibuprofen, advil, aleve, naproxen, naprosyn) for 7 days prior to the procedure.  If you have any questions about stopping these medications, please contact your primary care physician or cardiologist.  Having a light meal prior to the procedure is recommended.  If you are diabetic or have low blood sugar please bring a small snack or glucose tablet.  A Fleets enema is needed to be purchased over the counter at a local pharmacy and used 2 hours before you scheduled appointment.  This can be purchased over the counter at any pharmacy.  Antibiotics will be administered in the clinic at the time of the procedure and 1 tablet has been sent to your pharmacy. Please take the antibiotic as prescribed.    Please bring someone with you to the procedure to drive you home if you are given a valium to take prior to your procedure.   If you have any questions or concerns, please feel free to call the office at [phone] or send a Mychart message.    Thank you, [facility]

## 2022-02-27 LAB — PSA, TOTAL AND FREE
PSA, Free Pct: 33.3 %
PSA, Free: 3.2 ng/mL
Prostate Specific Ag, Serum: 9.6 ng/mL — ABNORMAL HIGH (ref 0.0–4.0)

## 2022-03-02 ENCOUNTER — Other Ambulatory Visit: Payer: Self-pay | Admitting: Urology

## 2022-03-02 DIAGNOSIS — N403 Nodular prostate with lower urinary tract symptoms: Secondary | ICD-10-CM

## 2022-03-02 DIAGNOSIS — R972 Elevated prostate specific antigen [PSA]: Secondary | ICD-10-CM

## 2022-03-09 ENCOUNTER — Telehealth: Payer: Self-pay

## 2022-03-09 ENCOUNTER — Other Ambulatory Visit: Payer: Self-pay

## 2022-03-09 DIAGNOSIS — R972 Elevated prostate specific antigen [PSA]: Secondary | ICD-10-CM

## 2022-03-10 NOTE — Telephone Encounter (Signed)
Open in error

## 2022-03-11 ENCOUNTER — Telehealth: Payer: Self-pay

## 2022-03-11 ENCOUNTER — Other Ambulatory Visit: Payer: Self-pay | Admitting: Urology

## 2022-03-11 ENCOUNTER — Ambulatory Visit (HOSPITAL_COMMUNITY)
Admission: RE | Admit: 2022-03-11 | Discharge: 2022-03-11 | Disposition: A | Discharge status: Home / Self Care | Payer: Medicare HMO | Source: Ambulatory Visit | Attending: Urology | Admitting: Urology

## 2022-03-11 DIAGNOSIS — R972 Elevated prostate specific antigen [PSA]: Secondary | ICD-10-CM

## 2022-03-11 DIAGNOSIS — N403 Nodular prostate with lower urinary tract symptoms: Secondary | ICD-10-CM

## 2022-03-11 MED ORDER — GADOBUTROL 1 MMOL/ML IV SOLN
10.0000 mL | Freq: Once | INTRAVENOUS | Status: AC | PRN
  Administered 2022-03-11: 10 mL via INTRAVENOUS

## 2022-03-11 MED ORDER — DIAZEPAM 5 MG PO TABS: ORAL_TABLET | ORAL | 0 refills | Status: DC

## 2022-03-11 NOTE — Telephone Encounter (Signed)
Patient is aware that our office will follow up with Dr. Jeffie Pollock on proceeding with the prostate biopsy. Patient voiced understanding

## 2022-03-11 NOTE — Telephone Encounter (Signed)
Patient is aware that the MRI isn't read yet and  probably need to reschedule the biopsy in case the MRI is positive and he needs an MR fusion biopsy instead of a standard biopsy.  Patient voiced understanding.

## 2022-03-12 ENCOUNTER — Other Ambulatory Visit: Payer: Self-pay | Admitting: Urology

## 2022-03-12 ENCOUNTER — Ambulatory Visit (HOSPITAL_COMMUNITY): Admission: RE | Admit: 2022-03-12 | Payer: Medicare HMO | Source: Ambulatory Visit

## 2022-03-12 ENCOUNTER — Other Ambulatory Visit: Payer: Medicare HMO | Admitting: Urology

## 2022-03-12 DIAGNOSIS — N403 Nodular prostate with lower urinary tract symptoms: Secondary | ICD-10-CM

## 2022-03-12 DIAGNOSIS — R972 Elevated prostate specific antigen [PSA]: Secondary | ICD-10-CM

## 2022-03-12 NOTE — Progress Notes (Signed)
I have reviewed the MR results with him and will get him set up for an MR fusion biopsy in [place].

## 2022-04-15 DIAGNOSIS — N411 Chronic prostatitis: Secondary | ICD-10-CM | POA: Diagnosis not present

## 2022-04-15 DIAGNOSIS — N410 Acute prostatitis: Secondary | ICD-10-CM | POA: Diagnosis not present

## 2022-04-15 DIAGNOSIS — R972 Elevated prostate specific antigen [PSA]: Secondary | ICD-10-CM | POA: Diagnosis not present

## 2022-04-20 ENCOUNTER — Other Ambulatory Visit: Payer: Self-pay | Admitting: Urology

## 2022-04-20 DIAGNOSIS — R3129 Other microscopic hematuria: Secondary | ICD-10-CM

## 2022-04-20 NOTE — Progress Notes (Signed)
His prostate biopsy was negative.    He had 3-10 RBC's on his last UA.  I will get a hematuria CT and have him return for possible cystoscopy.

## 2022-05-01 ENCOUNTER — Ambulatory Visit (HOSPITAL_COMMUNITY)
Admission: RE | Admit: 2022-05-01 | Discharge: 2022-05-01 | Disposition: A | Discharge status: Home / Self Care | Payer: Medicare HMO | Source: Ambulatory Visit | Attending: Urology | Admitting: Urology

## 2022-05-01 ENCOUNTER — Encounter (HOSPITAL_COMMUNITY): Payer: Self-pay

## 2022-05-01 DIAGNOSIS — R3129 Other microscopic hematuria: Secondary | ICD-10-CM | POA: Insufficient documentation

## 2022-05-01 DIAGNOSIS — R319 Hematuria, unspecified: Secondary | ICD-10-CM | POA: Diagnosis not present

## 2022-05-01 LAB — POCT I-STAT CREATININE: Creatinine, Ser: 2.1 mg/dL — ABNORMAL HIGH (ref 0.61–1.24)

## 2022-05-01 MED ORDER — IOHEXOL 300 MG/ML  SOLN: 150.0000 mL | Freq: Once | INTRAMUSCULAR | Status: DC | PRN

## 2022-05-01 MED ORDER — IOHEXOL 300 MG/ML  SOLN: 100.0000 mL | Freq: Once | INTRAMUSCULAR | Status: DC | PRN

## 2022-05-01 MED ORDER — IOHEXOL 300 MG/ML  SOLN
100.0000 mL | Freq: Once | INTRAMUSCULAR | Status: AC | PRN
  Administered 2022-05-01: 100 mL via INTRAVENOUS

## 2022-05-22 DIAGNOSIS — Z6841 Body Mass Index (BMI) 40.0 and over, adult: Secondary | ICD-10-CM | POA: Diagnosis not present

## 2022-05-22 DIAGNOSIS — Z6839 Body mass index (BMI) 39.0-39.9, adult: Secondary | ICD-10-CM | POA: Diagnosis not present

## 2022-05-22 DIAGNOSIS — T1502XA Foreign body in cornea, left eye, initial encounter: Secondary | ICD-10-CM | POA: Diagnosis not present

## 2022-05-22 DIAGNOSIS — N183 Chronic kidney disease, stage 3 unspecified: Secondary | ICD-10-CM | POA: Diagnosis not present

## 2022-05-22 DIAGNOSIS — R030 Elevated blood-pressure reading, without diagnosis of hypertension: Secondary | ICD-10-CM | POA: Diagnosis not present

## 2022-05-23 DIAGNOSIS — H5712 Ocular pain, left eye: Secondary | ICD-10-CM | POA: Diagnosis not present

## 2022-05-23 DIAGNOSIS — Z6841 Body Mass Index (BMI) 40.0 and over, adult: Secondary | ICD-10-CM | POA: Diagnosis not present

## 2022-05-23 DIAGNOSIS — R030 Elevated blood-pressure reading, without diagnosis of hypertension: Secondary | ICD-10-CM | POA: Diagnosis not present

## 2022-05-28 ENCOUNTER — Encounter: Payer: Self-pay | Admitting: Urology

## 2022-06-25 ENCOUNTER — Other Ambulatory Visit: Payer: Medicare HMO | Admitting: Urology

## 2022-08-06 ENCOUNTER — Ambulatory Visit: Payer: Medicare HMO | Admitting: Urology

## 2022-08-06 ENCOUNTER — Encounter: Payer: Self-pay | Admitting: Urology

## 2022-08-06 VITALS — BP 167/82 | HR 80

## 2022-08-06 DIAGNOSIS — N481 Balanitis: Secondary | ICD-10-CM

## 2022-08-06 DIAGNOSIS — R3129 Other microscopic hematuria: Secondary | ICD-10-CM

## 2022-08-06 DIAGNOSIS — N390 Urinary tract infection, site not specified: Secondary | ICD-10-CM | POA: Diagnosis not present

## 2022-08-06 LAB — MICROSCOPIC EXAMINATION: Bacteria, UA: NONE SEEN

## 2022-08-06 LAB — URINALYSIS, ROUTINE W REFLEX MICROSCOPIC
Bilirubin, UA: NEGATIVE
Glucose, UA: NEGATIVE
Ketones, UA: NEGATIVE
Nitrite, UA: NEGATIVE
Specific Gravity, UA: 1.025 (ref 1.005–1.030)
Urobilinogen, Ur: 0.2 mg/dL (ref 0.2–1.0)
pH, UA: 5.5 (ref 5.0–7.5)

## 2022-08-06 MED ORDER — NYSTATIN 100000 UNIT/GM EX CREA: TOPICAL_CREAM | CUTANEOUS | 1 refills | Status: DC

## 2022-08-06 NOTE — Progress Notes (Signed)
Subjective: 1. Microhematuria   2. Urinary tract infection without hematuria, site unspecified   3. Balanitis      Consult requested by Dr. Elfredia Nevins.  08/06/22: Dale Green returns today for cystoscopy but he thinks he has a UTI and is having dysuria.  Also his wife is currently in the ER for palpitations and he needs to get back to the ER.  His UA just has 3-10 RBC's.  He had a negative MR fusion biopsy in March.   He had a CT in April for microhematuria. :   IMPRESSION: No radiographic evidence of urinary tract neoplasm, urolithiasis, or hydronephrosis.   Bilateral renal parenchymal scarring, left side greater than right.   Mild-to-moderately enlarged prostate, and findings of chronic bladder outlet obstruction.   Aortic Atherosclerosis (ICD10-I70.0).  02/26/22: Dale Green returns today in f/u with a PSA for f/u of his history of an elevated PSA and prostate induration with a negative prior biopsy.  HIs PSA is up markedly to 23.5 on 1/31 from 4.37 on 07/31/21.  He has not had COVID or UTI symptoms. His IPSS is 12 with some urgency and a reduced stream.  We increased the tamsulosin to 0.8 at his last visit but he didn't notice much benefit. He has a bit more intermittency.  His UA has 3-10 RBC's.  He had an ExoDx of 30.   08/28/21: Dale Green is a 67 yo male who is sent for evaluation of an elevated PSA of 4.37 on 07/31/21.  He has BPH with BOO and is on tamsulosin.  His IPSS is 9.  He has urgency and a reduced stream.   He reports his PSA has been borderline high for some time but I don't have the results.  He has had a prior negative prostate biopsy in 2019 with [facility] for a PSA of 8.5 and a nodule.   He has had no UTI's or stones.  He has had no other GU procedures.           ROS:  Review of Systems  Constitutional:  Negative for weight loss.  All other systems reviewed and are negative.   No Known Allergies  Past Medical History:  Diagnosis Date   BPH  (benign prostatic hyperplasia)    Gout    Osteoarthritis     Past Surgical History:  Procedure Laterality Date   BIOPSY  05/29/2020   Procedure: BIOPSY;  Surgeon: Dolores Frame, MD;  [facility];  Service: Gastroenterology;;   COLONOSCOPY WITH PROPOFOL N/A 05/29/2020   Procedure: COLONOSCOPY WITH PROPOFOL;  Surgeon: Dolores Frame, MD;  [facility];  Service: Gastroenterology;  Laterality: N/A;  11:00   POLYPECTOMY  05/29/2020   Procedure: POLYPECTOMY;  Surgeon: Marguerita Merles, Reuel Boom, MD;  [facility];  Service: Gastroenterology;;   ureathral surgery      Social History   Socioeconomic History   Marital status: Married    Spouse name: Not on file   Number of children: Not on file   Years of education: Not on file   Highest education level: Not on file  Occupational History   Not on file  Tobacco Use   Smoking status: Former   Smokeless tobacco: Never  Vaping Use   Vaping status: Never Used  Substance and Sexual Activity   Alcohol use: No   Drug use: No   Sexual activity: Not on file  Other Topics Concern   Not on file  Social History Narrative  Not on file   Social Determinants of Health   Financial Resource Strain: Not on file  Food Insecurity: Not on file  Transportation Needs: Not on file  Physical Activity: Not on file  Stress: Not on file  Social Connections: Not on file  Intimate Partner Violence: Not on file    Family History  Problem Relation Age of Onset   Healthy Mother    Heart disease Father    Healthy Brother     Anti-infectives: Anti-infectives (From admission, onward)    None       Current Outpatient Medications  Medication Sig Dispense Refill   allopurinol (ZYLOPRIM) 300 MG tablet Take 300 mg by mouth daily.  0   Cholecalciferol (VITAMIN D3) 125 MCG (5000 UT) CAPS Take 5,000 Units by mouth daily.     clotrimazole-betamethasone (LOTRISONE) cream Apply 1 Application  topically 2 (two) times daily. 30 g 0   diazepam (VALIUM) 5 MG tablet Take 2 tabs one hour prior to MRI. 2 tablet 0   gabapentin (NEURONTIN) 100 MG capsule Take 100 mg by mouth at bedtime.     levofloxacin (LEVAQUIN) 750 MG tablet Take 1 hour prior to the procedure. 1 tablet 0   Methylsulfonylmethane (MSM) POWD Take 4 g by mouth at bedtime.     nystatin cream (MYCOSTATIN) Apply sparingly to affected area 2-3x daily. 30 g 1   tamsulosin (FLOMAX) 0.4 MG CAPS capsule Take 0.4 mg by mouth daily.     traMADol (ULTRAM) 50 MG tablet Take 50 mg by mouth daily as needed for severe pain.  0   No current facility-administered medications for this visit.     Objective: Vital signs in last 24 hours: BP (!) 167/82   Pulse 80   Intake/Output from previous day: No intake/output data recorded. Intake/Output this shift: @IOTHISSHIFT @   Physical Exam Vitals reviewed.  Constitutional:      Appearance: Normal appearance. He is obese.  Genitourinary:    Comments: Uncirc phallus with some glanular erythema around the meatus Scrotum, testes and left epdidymis normal. Right epdidymis has some beading.  Neurological:     Mental Status: He is alert.     Lab Results:  No results found for this or any previous visit (from the past 24 hour(s)).   BMET No results for input(s): "NA", "K", "CL", "CO2", "GLUCOSE", "BUN", "CREATININE", "CALCIUM" in the last 72 hours. PT/INR No results for input(s): "LABPROT", "INR" in the last 72 hours. ABG No results for input(s): "PHART", "HCO3" in the last 72 hours.  Invalid input(s): "PCO2", "PO2" UA 3+ protein with hyaline casts but o/w unremarkable.   Outside labs reviewed.  Cr was 1.47 on 07/31/21.  Studies/Results: No results found. Prior prostate [place] and biopsy result reviewed.  His prostate was 63ml.   Assessment/Plan: Nodular Prostate with LUTS and elevated PSA.  Negative MR fusion biopsy and stable LUTS. .  Microhematuria.  He has 3-10 RBC's on UA  today.   Reschedule cystoscopy.   Balanitis. Nystatin sent.   Meds ordered this encounter  Medications   nystatin cream (MYCOSTATIN)    Sig: Apply sparingly to affected area 2-3x daily.    Dispense:  30 g    Refill:  1     Orders Placed This Encounter  Procedures   Microscopic Examination   Urinalysis, Routine w reflex microscopic     Return for Next available reschedule cystoscopy. .    CC: Dr. Elfredia Nevins.      Dale Green 08/07/2022 [phone]  ID: Dale Green, male   DOB: Aug 03, 1955, 67 y.o.   MRN: [id number]

## 2022-09-09 DIAGNOSIS — G9332 Myalgic encephalomyelitis/chronic fatigue syndrome: Secondary | ICD-10-CM | POA: Diagnosis not present

## 2022-09-09 DIAGNOSIS — E559 Vitamin D deficiency, unspecified: Secondary | ICD-10-CM | POA: Diagnosis not present

## 2022-09-09 DIAGNOSIS — Z125 Encounter for screening for malignant neoplasm of prostate: Secondary | ICD-10-CM | POA: Diagnosis not present

## 2022-09-09 DIAGNOSIS — N183 Chronic kidney disease, stage 3 unspecified: Secondary | ICD-10-CM | POA: Diagnosis not present

## 2022-09-09 DIAGNOSIS — I10 Essential (primary) hypertension: Secondary | ICD-10-CM | POA: Diagnosis not present

## 2022-09-09 DIAGNOSIS — N401 Enlarged prostate with lower urinary tract symptoms: Secondary | ICD-10-CM | POA: Diagnosis not present

## 2022-09-09 DIAGNOSIS — Z0001 Encounter for general adult medical examination with abnormal findings: Secondary | ICD-10-CM | POA: Diagnosis not present

## 2022-09-09 DIAGNOSIS — M47812 Spondylosis without myelopathy or radiculopathy, cervical region: Secondary | ICD-10-CM | POA: Diagnosis not present

## 2022-09-09 DIAGNOSIS — D518 Other vitamin B12 deficiency anemias: Secondary | ICD-10-CM | POA: Diagnosis not present

## 2022-09-09 DIAGNOSIS — E538 Deficiency of other specified B group vitamins: Secondary | ICD-10-CM | POA: Diagnosis not present

## 2022-09-09 DIAGNOSIS — E782 Mixed hyperlipidemia: Secondary | ICD-10-CM | POA: Diagnosis not present

## 2022-09-09 DIAGNOSIS — G629 Polyneuropathy, unspecified: Secondary | ICD-10-CM | POA: Diagnosis not present

## 2022-09-09 DIAGNOSIS — M1991 Primary osteoarthritis, unspecified site: Secondary | ICD-10-CM | POA: Diagnosis not present

## 2022-09-09 DIAGNOSIS — Z6841 Body Mass Index (BMI) 40.0 and over, adult: Secondary | ICD-10-CM | POA: Diagnosis not present

## 2022-10-19 DIAGNOSIS — E782 Mixed hyperlipidemia: Secondary | ICD-10-CM | POA: Diagnosis not present

## 2022-10-19 DIAGNOSIS — N183 Chronic kidney disease, stage 3 unspecified: Secondary | ICD-10-CM | POA: Diagnosis not present

## 2022-10-19 DIAGNOSIS — M1A00X0 Idiopathic chronic gout, unspecified site, without tophus (tophi): Secondary | ICD-10-CM | POA: Diagnosis not present

## 2022-11-06 DIAGNOSIS — M47812 Spondylosis without myelopathy or radiculopathy, cervical region: Secondary | ICD-10-CM | POA: Diagnosis not present

## 2022-11-06 DIAGNOSIS — N183 Chronic kidney disease, stage 3 unspecified: Secondary | ICD-10-CM | POA: Diagnosis not present

## 2022-11-06 DIAGNOSIS — Z6841 Body Mass Index (BMI) 40.0 and over, adult: Secondary | ICD-10-CM | POA: Diagnosis not present

## 2022-11-06 DIAGNOSIS — I10 Essential (primary) hypertension: Secondary | ICD-10-CM | POA: Diagnosis not present

## 2022-11-06 DIAGNOSIS — M1991 Primary osteoarthritis, unspecified site: Secondary | ICD-10-CM | POA: Diagnosis not present

## 2022-11-19 DIAGNOSIS — I129 Hypertensive chronic kidney disease with stage 1 through stage 4 chronic kidney disease, or unspecified chronic kidney disease: Secondary | ICD-10-CM | POA: Diagnosis not present

## 2022-11-19 DIAGNOSIS — E782 Mixed hyperlipidemia: Secondary | ICD-10-CM | POA: Diagnosis not present

## 2023-05-26 ENCOUNTER — Other Ambulatory Visit (HOSPITAL_COMMUNITY): Payer: Self-pay | Admitting: Internal Medicine

## 2023-05-26 DIAGNOSIS — Z9229 Personal history of other drug therapy: Secondary | ICD-10-CM | POA: Diagnosis not present

## 2023-05-26 DIAGNOSIS — R519 Headache, unspecified: Secondary | ICD-10-CM

## 2023-05-26 DIAGNOSIS — R269 Unspecified abnormalities of gait and mobility: Secondary | ICD-10-CM | POA: Diagnosis not present

## 2023-05-26 DIAGNOSIS — I129 Hypertensive chronic kidney disease with stage 1 through stage 4 chronic kidney disease, or unspecified chronic kidney disease: Secondary | ICD-10-CM | POA: Diagnosis not present

## 2023-05-26 DIAGNOSIS — M48062 Spinal stenosis, lumbar region with neurogenic claudication: Secondary | ICD-10-CM

## 2023-05-26 DIAGNOSIS — N401 Enlarged prostate with lower urinary tract symptoms: Secondary | ICD-10-CM | POA: Diagnosis not present

## 2023-05-26 DIAGNOSIS — R42 Dizziness and giddiness: Secondary | ICD-10-CM

## 2023-05-26 DIAGNOSIS — M5481 Occipital neuralgia: Secondary | ICD-10-CM

## 2023-05-26 DIAGNOSIS — I10 Essential (primary) hypertension: Secondary | ICD-10-CM | POA: Diagnosis not present

## 2023-05-26 DIAGNOSIS — M47812 Spondylosis without myelopathy or radiculopathy, cervical region: Secondary | ICD-10-CM | POA: Diagnosis not present

## 2023-05-26 DIAGNOSIS — M1991 Primary osteoarthritis, unspecified site: Secondary | ICD-10-CM | POA: Diagnosis not present

## 2023-06-03 ENCOUNTER — Ambulatory Visit (HOSPITAL_COMMUNITY)

## 2023-06-03 ENCOUNTER — Encounter (HOSPITAL_COMMUNITY): Payer: Self-pay

## 2023-06-08 ENCOUNTER — Ambulatory Visit (HOSPITAL_COMMUNITY)
Admission: RE | Admit: 2023-06-08 | Discharge: 2023-06-08 | Disposition: A | Discharge status: Home / Self Care | Source: Ambulatory Visit | Attending: Internal Medicine | Admitting: Internal Medicine

## 2023-06-08 DIAGNOSIS — M5481 Occipital neuralgia: Secondary | ICD-10-CM

## 2023-06-08 DIAGNOSIS — I6782 Cerebral ischemia: Secondary | ICD-10-CM | POA: Diagnosis not present

## 2023-06-08 DIAGNOSIS — M48062 Spinal stenosis, lumbar region with neurogenic claudication: Secondary | ICD-10-CM | POA: Diagnosis not present

## 2023-06-08 DIAGNOSIS — M47816 Spondylosis without myelopathy or radiculopathy, lumbar region: Secondary | ICD-10-CM | POA: Diagnosis not present

## 2023-06-08 DIAGNOSIS — R519 Headache, unspecified: Secondary | ICD-10-CM | POA: Insufficient documentation

## 2023-06-08 DIAGNOSIS — R42 Dizziness and giddiness: Secondary | ICD-10-CM

## 2023-06-08 DIAGNOSIS — M5023 Other cervical disc displacement, cervicothoracic region: Secondary | ICD-10-CM | POA: Diagnosis not present

## 2023-06-08 DIAGNOSIS — M5126 Other intervertebral disc displacement, lumbar region: Secondary | ICD-10-CM | POA: Diagnosis not present

## 2023-06-08 DIAGNOSIS — M5134 Other intervertebral disc degeneration, thoracic region: Secondary | ICD-10-CM | POA: Diagnosis not present

## 2023-06-08 DIAGNOSIS — M47812 Spondylosis without myelopathy or radiculopathy, cervical region: Secondary | ICD-10-CM | POA: Diagnosis not present

## 2023-06-08 DIAGNOSIS — M51369 Other intervertebral disc degeneration, lumbar region without mention of lumbar back pain or lower extremity pain: Secondary | ICD-10-CM | POA: Diagnosis not present

## 2023-06-08 DIAGNOSIS — E869 Volume depletion, unspecified: Secondary | ICD-10-CM | POA: Diagnosis not present

## 2023-07-27 DIAGNOSIS — G959 Disease of spinal cord, unspecified: Secondary | ICD-10-CM | POA: Diagnosis not present

## 2023-07-27 DIAGNOSIS — M5412 Radiculopathy, cervical region: Secondary | ICD-10-CM | POA: Diagnosis not present

## 2023-07-27 DIAGNOSIS — Z6839 Body mass index (BMI) 39.0-39.9, adult: Secondary | ICD-10-CM | POA: Diagnosis not present

## 2023-08-09 ENCOUNTER — Other Ambulatory Visit (HOSPITAL_COMMUNITY): Payer: Self-pay | Admitting: Neurological Surgery

## 2023-08-09 DIAGNOSIS — M5412 Radiculopathy, cervical region: Secondary | ICD-10-CM

## 2023-08-19 ENCOUNTER — Ambulatory Visit (HOSPITAL_COMMUNITY)
Admission: RE | Admit: 2023-08-19 | Discharge: 2023-08-19 | Disposition: A | Discharge status: Home / Self Care | Source: Ambulatory Visit | Attending: Neurological Surgery | Admitting: Neurological Surgery

## 2023-08-19 DIAGNOSIS — G959 Disease of spinal cord, unspecified: Secondary | ICD-10-CM | POA: Insufficient documentation

## 2023-08-19 DIAGNOSIS — M5412 Radiculopathy, cervical region: Secondary | ICD-10-CM | POA: Insufficient documentation

## 2023-08-19 DIAGNOSIS — M4802 Spinal stenosis, cervical region: Secondary | ICD-10-CM | POA: Diagnosis not present

## 2023-08-19 DIAGNOSIS — M4803 Spinal stenosis, cervicothoracic region: Secondary | ICD-10-CM | POA: Diagnosis not present

## 2023-08-19 DIAGNOSIS — M4722 Other spondylosis with radiculopathy, cervical region: Secondary | ICD-10-CM | POA: Diagnosis not present

## 2023-09-17 DIAGNOSIS — Z6841 Body Mass Index (BMI) 40.0 and over, adult: Secondary | ICD-10-CM | POA: Diagnosis not present

## 2023-09-17 DIAGNOSIS — G959 Disease of spinal cord, unspecified: Secondary | ICD-10-CM | POA: Diagnosis not present

## 2023-09-27 ENCOUNTER — Other Ambulatory Visit: Payer: Self-pay | Admitting: Neurological Surgery

## 2023-10-20 NOTE — Pre-Procedure Instructions (Signed)
 Surgical Instructions   Your procedure is scheduled on October 28, 2023. Report to [facility] Main Entrance A at 5:30 A.M., then check in with the Admitting office. Any questions or running late day of surgery: call [phone]  Questions prior to your surgery date: call [phone], Monday-Friday, 8am-4pm. If you experience any cold or flu symptoms such as cough, fever, chills, shortness of breath, etc. between now and your scheduled surgery, please notify us  at the above number.     Remember:  Do not eat or drink after midnight the night before your surgery    Take these medicines the morning of surgery with A SIP OF WATER : allopurinol  (ZYLOPRIM )  rosuvastatin (CRESTOR)  tamsulosin (FLOMAX)  penicillin v potassium (VEETID)    One week prior to surgery, STOP taking any Aspirin (unless otherwise instructed by your surgeon) Aleve, Naproxen, Ibuprofen, Motrin, Advil, Goody's, BC's, all herbal medications, fish oil, and non-prescription vitamins.                     Do NOT Smoke (Tobacco/Vaping) for 24 hours prior to your procedure.  If you use a CPAP at night, you may bring your mask/headgear for your overnight stay.   You will be asked to remove any contacts, glasses, piercing's, hearing aid's, dentures/partials prior to surgery. Please bring cases for these items if needed.    Patients discharged the day of surgery will not be allowed to drive home, and someone needs to stay with them for 24 hours.  SURGICAL WAITING ROOM VISITATION Patients may have no more than 2 support people in the waiting area - these visitors may rotate.   Pre-op nurse will coordinate an appropriate time for 1 ADULT support person, who may not rotate, to accompany patient in pre-op.  Children under the age of 44 must have an adult with them who is not the patient and must remain in the main waiting area with an adult.  If the patient needs to stay at the hospital during part of their recovery, the  visitor guidelines for inpatient rooms apply.  Please refer to the [facility] website for the visitor guidelines for any additional information.   If you received a COVID test during your pre-op visit  it is requested that you wear a mask when out in public, stay away from anyone that may not be feeling well and notify your surgeon if you develop symptoms. If you have been in contact with anyone that has tested positive in the last 10 days please notify you surgeon.      Pre-operative 5 CHG Bathing Instructions   You can play a key role in reducing the risk of infection after surgery. Your skin needs to be as free of germs as possible. You can reduce the number of germs on your skin by washing with CHG (chlorhexidine gluconate) soap before surgery. CHG is an antiseptic soap that kills germs and continues to kill germs even after washing.   DO NOT use if you have an allergy to chlorhexidine/CHG or antibacterial soaps. If your skin becomes reddened or irritated, stop using the CHG and notify one of our RNs at [phone].   Please shower with the CHG soap starting 4 days before surgery using the following schedule:     Please keep in mind the following:  DO NOT shave, including legs and underarms, starting the day of your first shower.   You may shave your face at any point before/day of surgery.  Place  clean sheets on your bed the day you start using CHG soap. Use a clean washcloth (not used since being washed) for each shower. DO NOT sleep with pets once you start using the CHG.   CHG Shower Instructions:  Wash your face and private area with normal soap. If you choose to wash your hair, wash first with your normal shampoo.  After you use shampoo/soap, rinse your hair and body thoroughly to remove shampoo/soap residue.  Turn the water  OFF and apply about 3 tablespoons (45 ml) of CHG soap to a CLEAN washcloth.  Apply CHG soap ONLY FROM YOUR NECK DOWN TO YOUR TOES (washing for 3-5  minutes)  DO NOT use CHG soap on face, private areas, open wounds, or sores.  Pay special attention to the area where your surgery is being performed.  If you are having back surgery, having someone wash your back for you may be helpful. Wait 2 minutes after CHG soap is applied, then you may rinse off the CHG soap.  Pat dry with a clean towel  Put on clean clothes/pajamas   If you choose to wear lotion, please use ONLY the CHG-compatible lotions that are listed below.  Additional instructions for the day of surgery: DO NOT APPLY any lotions, deodorants, cologne, or perfumes.   Do not bring valuables to the hospital. [facility] is not responsible for any belongings/valuables. Do not wear nail polish, gel polish, artificial nails, or any other type of covering on natural nails (fingers and toes) Do not wear jewelry or makeup Put on clean/comfortable clothes.  Please brush your teeth.  Ask your nurse before applying any prescription medications to the skin.     CHG Compatible Lotions   Aveeno Moisturizing lotion  Cetaphil Moisturizing Cream  Cetaphil Moisturizing Lotion  Clairol Herbal Essence Moisturizing Lotion, Dry Skin  Clairol Herbal Essence Moisturizing Lotion, Extra Dry Skin  Clairol Herbal Essence Moisturizing Lotion, Normal Skin  Curel Age Defying Therapeutic Moisturizing Lotion with Alpha Hydroxy  Curel Extreme Care Body Lotion  Curel Soothing Hands Moisturizing Hand Lotion  Curel Therapeutic Moisturizing Cream, Fragrance-Free  Curel Therapeutic Moisturizing Lotion, Fragrance-Free  Curel Therapeutic Moisturizing Lotion, Original Formula  Eucerin Daily Replenishing Lotion  Eucerin Dry Skin Therapy Plus Alpha Hydroxy Crme  Eucerin Dry Skin Therapy Plus Alpha Hydroxy Lotion  Eucerin Original Crme  Eucerin Original Lotion  Eucerin Plus Crme Eucerin Plus Lotion  Eucerin TriLipid Replenishing Lotion  Keri Anti-Bacterial Hand Lotion  Keri Deep Conditioning Original  Lotion Dry Skin Formula Softly Scented  Keri Deep Conditioning Original Lotion, Fragrance Free Sensitive Skin Formula  Keri Lotion Fast Absorbing Fragrance Free Sensitive Skin Formula  Keri Lotion Fast Absorbing Softly Scented Dry Skin Formula  Keri Original Lotion  Keri Skin Renewal Lotion Keri Silky Smooth Lotion  Keri Silky Smooth Sensitive Skin Lotion  Nivea Body Creamy Conditioning Oil  Nivea Body Extra Enriched Lotion  Nivea Body Original Lotion  Nivea Body Sheer Moisturizing Lotion Nivea Crme  Nivea Skin Firming Lotion  NutraDerm 30 Skin Lotion  NutraDerm Skin Lotion  NutraDerm Therapeutic Skin Cream  NutraDerm Therapeutic Skin Lotion  ProShield Protective Hand Cream  Provon moisturizing lotion  Please read over the following fact sheets that you were given.

## 2023-10-21 ENCOUNTER — Encounter (HOSPITAL_COMMUNITY)
Admission: RE | Admit: 2023-10-21 | Discharge: 2023-10-21 | Disposition: A | Discharge status: Home / Self Care | Source: Ambulatory Visit | Attending: Neurological Surgery | Admitting: Neurological Surgery

## 2023-10-21 ENCOUNTER — Other Ambulatory Visit: Payer: Self-pay

## 2023-10-21 ENCOUNTER — Encounter (HOSPITAL_COMMUNITY): Payer: Self-pay

## 2023-10-21 VITALS — BP 150/68 | HR 79 | Temp 97.8°F | Resp 18 | Ht 70.0 in | Wt 288.6 lb

## 2023-10-21 DIAGNOSIS — Z01818 Encounter for other preprocedural examination: Secondary | ICD-10-CM | POA: Diagnosis not present

## 2023-10-21 DIAGNOSIS — E785 Hyperlipidemia, unspecified: Secondary | ICD-10-CM | POA: Diagnosis not present

## 2023-10-21 DIAGNOSIS — Z87891 Personal history of nicotine dependence: Secondary | ICD-10-CM | POA: Insufficient documentation

## 2023-10-21 DIAGNOSIS — Z6841 Body Mass Index (BMI) 40.0 and over, adult: Secondary | ICD-10-CM | POA: Insufficient documentation

## 2023-10-21 DIAGNOSIS — I129 Hypertensive chronic kidney disease with stage 1 through stage 4 chronic kidney disease, or unspecified chronic kidney disease: Secondary | ICD-10-CM | POA: Insufficient documentation

## 2023-10-21 DIAGNOSIS — G4733 Obstructive sleep apnea (adult) (pediatric): Secondary | ICD-10-CM | POA: Insufficient documentation

## 2023-10-21 DIAGNOSIS — Z01810 Encounter for preprocedural cardiovascular examination: Secondary | ICD-10-CM | POA: Diagnosis present

## 2023-10-21 DIAGNOSIS — N183 Chronic kidney disease, stage 3 unspecified: Secondary | ICD-10-CM | POA: Insufficient documentation

## 2023-10-21 DIAGNOSIS — M109 Gout, unspecified: Secondary | ICD-10-CM | POA: Insufficient documentation

## 2023-10-21 DIAGNOSIS — M5412 Radiculopathy, cervical region: Secondary | ICD-10-CM | POA: Diagnosis not present

## 2023-10-21 DIAGNOSIS — G959 Disease of spinal cord, unspecified: Secondary | ICD-10-CM | POA: Insufficient documentation

## 2023-10-21 DIAGNOSIS — Z01812 Encounter for preprocedural laboratory examination: Secondary | ICD-10-CM | POA: Diagnosis present

## 2023-10-21 DIAGNOSIS — I10 Essential (primary) hypertension: Secondary | ICD-10-CM

## 2023-10-21 HISTORY — DX: Essential (primary) hypertension: I10

## 2023-10-21 HISTORY — DX: Chronic kidney disease, unspecified: N18.9

## 2023-10-21 HISTORY — DX: Hyperlipidemia, unspecified: E78.5

## 2023-10-21 LAB — CBC
HCT: 44.2 % (ref 39.0–52.0)
Hemoglobin: 14 g/dL (ref 13.0–17.0)
MCH: 28.9 pg (ref 26.0–34.0)
MCHC: 31.7 g/dL (ref 30.0–36.0)
MCV: 91.1 fL (ref 80.0–100.0)
Platelets: 216 K/uL (ref 150–400)
RBC: 4.85 MIL/uL (ref 4.22–5.81)
RDW: 13.6 % (ref 11.5–15.5)
WBC: 14.6 K/uL — ABNORMAL HIGH (ref 4.0–10.5)
nRBC: 0 % (ref 0.0–0.2)

## 2023-10-21 LAB — BASIC METABOLIC PANEL WITH GFR
Anion gap: 10 (ref 5–15)
BUN: 16 mg/dL (ref 8–23)
CO2: 26 mmol/L (ref 22–32)
Calcium: 9 mg/dL (ref 8.9–10.3)
Chloride: 102 mmol/L (ref 98–111)
Creatinine, Ser: 1.75 mg/dL — ABNORMAL HIGH (ref 0.61–1.24)
GFR, Estimated: 41 mL/min — ABNORMAL LOW (ref >60)
Glucose, Bld: 140 mg/dL — ABNORMAL HIGH (ref 70–99)
Potassium: 4.2 mmol/L (ref 3.5–5.1)
Sodium: 138 mmol/L (ref 135–145)

## 2023-10-21 LAB — TYPE AND SCREEN
ABO/RH(D): A POS
Antibody Screen: NEGATIVE

## 2023-10-21 LAB — SURGICAL PCR SCREEN
MRSA, PCR: NEGATIVE
Staphylococcus aureus: NEGATIVE

## 2023-10-21 NOTE — Progress Notes (Addendum)
 PCP - Had been seeing Dr. Bertell, but MD recently retired. Pt states he is currently without a PCP but is working with his insurance to obtain a new PCP Cardiologist - denies    Chest x-ray -  EKG - 10/21/23 Stress Test -  ECHO - 11/17/2016 Cardiac Cath -   Sleep Study - denies, STOP BANG score 5--no PCP currently to forward results CPAP -   Fasting Blood Sugar - n/a Checks Blood Sugar _____ times a day  Last dose of GLP1 agonist-  n/a GLP1 instructions:   Blood Thinner Instructions: n/a Aspirin Instructions:  ERAS Protcol - NPO   COVID TEST- n/a  Pt had recent course of antibiotics for a tooth extraction. Due to complete course on Oct 3.  Patient denies shortness of breath, fever, cough and chest pain at PAT appointment  Addendum: Called left message at surgeon's office regarding elevated WBC count.

## 2023-10-22 NOTE — Progress Notes (Signed)
 Anesthesia Chart Review:  Case: [id number] Date/Time: 10/28/23 0715   Procedure: POSTERIOR CERVICAL LAMINECTOMY - Post cervical lami/multi level C3-C7 with posterior cervical fusion with facet screws   Anesthesia type: General   Diagnosis: Cervical myelopathy with cervical radiculopathy (HCC) [G95.9, M54.12]   Pre-op diagnosis: Cervical myelopathy with cervical radiculopathy   [facility]: [facility] ROOM [facility] / [facility] OR   Surgeons: Colon Shove, MD       DISCUSSION: Patient is a 68 year old male scheduled for the above procedure.  History includes former smoker, HTN, HLD, osteoarthritis, gout, CKD. Elevated OSA screening score of 5. BMI is consistent with morbid obesity.  He is s/p 10 day course (completed 10/22/2023) of Veetid following tooth extraction.   WBC 14.6K. PAT RN notified surgeon's office. Creatinine 1.75, previous 2.10 on 05/01/2022 and with known CKD history.   Anesthesia team to evaluate on the day of surgery.    VS: BP (!) 150/68   Pulse 79   Temp 36.6 C   Resp 18   Ht 5' 10 (1.778 m)   Wt 130.9 kg   SpO2 98%   BMI 41.41 kg/m    PROVIDERS: Bertell Satterfield, MD is PCP (recently retired) Watt Rush, MD is urologist   LABS: Preoperative labs noted. See DISCUSSION. (all labs ordered are listed, but only abnormal results are displayed)  Labs Reviewed  BASIC METABOLIC PANEL WITH GFR - Abnormal; Notable for the following components:      Result Value   Glucose, Bld 140 (*)    Creatinine, Ser 1.75 (*)    GFR, Estimated 41 (*)    All other components within normal limits  CBC - Abnormal; Notable for the following components:   WBC 14.6 (*)    All other components within normal limits  SURGICAL PCR SCREEN  TYPE AND SCREEN     IMAGES: CT C-spine 08/19/2023: IMPRESSION: 1. Severe central spinal canal stenosis and moderate bilateral neural foraminal stenosis at C5-6. 2. Severe central spinal canal stenosis and severe right neural foraminal stenosis at C6-7. 3.  Mild-to-moderate central spinal canal stenosis and bilateral neural foraminal stenosis at C7-T1. 4. Mild-to-moderate right neural foraminal stenosis at C2-3. 5. Mild central spinal canal stenosis and bilateral neural foraminal stenosis at C3-4. 6. Mild central spinal canal stenosis and left neural foraminal stenosis at C4-5. 7. Anterior bridging osteophytes extending from C2 through T1 compatible with diffuse idiopathic skeletal hyperostosis, causing rigid spine.   MRI C-spine 06/08/2023: IMPRESSION: - Degenerative changes as above. Severe spinal canal stenosis at C5-6 and C6-7 with cord compression and cord signal abnormality at these levels suggestive of myelomalacia. - Additional moderate to severe spinal canal stenosis at C4-5 with volume loss and signal abnormality in the left aspect of the cord suggestive of myelomalacia. - Multilevel foraminal stenosis, greatest and severe on the right at C6-7. Additional moderate foraminal stenosis at multiple levels. - Possible component of extruded disc contents versus ossification of the posterior longitudinal ligament at C6 contributing to spinal canal stenosis. Consider correlation with CT cervical spine. - Prominent endplate osteophytes suggestive of DISH.    MRI L-spine 06/08/2023: IMPRESSION: - Congenital narrowing of the spinal canal secondary to short pedicles particularly at L3 through L5 with superimposed degenerative changes. Disc bulge and moderate to severe facet arthrosis contributing to moderate to severe spinal canal stenosis. - Additional moderate spinal canal stenosis at L4-5 with lateral recess narrowing and possible impingement upon the traversing right L5 nerve roots. - Prominent lateral osteophytes at L5-S1 which likely contact  the extraforaminal right L5 nerve root. - Multilevel foraminal stenosis, greatest and severe on the left at L3-4. - Close apposition of spinous processes in the lower lumbar spine  with remodeling and mild edema at L3-4 and L4-5 suggestive of Baastrup disease. - ADDENDUM: In the first point of the impression the moderate to severe spinal canal stenosis referenced is at the L3-4 level.    EKG: 10/21/2023: NSR   CV: Echo 11/17/2016 (re: syncope in setting of orthostasis, dehydration, acute gout, on steroids): Study Conclusions  - Left ventricle: The cavity size was normal. Wall thickness was    increased in a pattern of mild LVH. Systolic function was normal.    The estimated ejection fraction was in the range of 60% to 65%.    Wall motion was normal; there were no regional wall motion    abnormalities. Left ventricular diastolic function parameters    were normal.  - Aortic valve: Valve area (VTI): 3.12 cm^2. Valve area (Vmax):    2.63 cm^2.  - Systemic veins: IVC is small, suggesting low RA pressure and    hypovolemia.  - Technically difficult study. Echocontrast was used to enhance    visualization.   Past Medical History:  Diagnosis Date   BPH (benign prostatic hyperplasia)    Chronic kidney disease    Gout    Hyperlipidemia    Hypertension    Osteoarthritis     Past Surgical History:  Procedure Laterality Date   BIOPSY  05/29/2020   Procedure: BIOPSY;  Surgeon: Eartha Angelia Sieving, MD;  Location: [facility];  Service: Gastroenterology;;   COLONOSCOPY WITH PROPOFOL  N/A 05/29/2020   Procedure: COLONOSCOPY WITH PROPOFOL ;  Surgeon: Eartha Angelia Sieving, MD;  Location: [facility];  Service: Gastroenterology;  Laterality: N/A;  11:00   POLYPECTOMY  05/29/2020   Procedure: POLYPECTOMY;  Surgeon: Eartha Angelia, Sieving, MD;  Location: [facility];  Service: Gastroenterology;;   TOOTH EXTRACTION     ureathral surgery  1970    MEDICATIONS:  allopurinol  (ZYLOPRIM ) 300 MG tablet   Cholecalciferol (VITAMIN D3 PO)   Flaxseed, Linseed, (FLAXSEED OIL PO)   Methylsulfonylmethane (MSM PO)   penicillin v potassium (VEETID) 500 MG  tablet   rosuvastatin (CRESTOR) 20 MG tablet   tamsulosin (FLOMAX) 0.4 MG CAPS capsule   No current facility-administered medications for this encounter.    Isaiah Ruder, PA-C Surgical Short Stay/Anesthesiology [facility] Phone [phone] [facility] Phone [phone] 10/22/2023 8:34 PM

## 2023-10-22 NOTE — Anesthesia Preprocedure Evaluation (Addendum)
 Anesthesia Evaluation  Patient identified by MRN, date of birth, ID band Patient awake    Reviewed: Allergy & Precautions, NPO status , Patient's Chart, lab work & pertinent test results  History of Anesthesia Complications Negative for: history of anesthetic complications  Airway Mallampati: III  TM Distance: >3 FB Neck ROM: Full    Dental  (+) Dental Advisory Given   Pulmonary former smoker   Pulmonary exam normal        Cardiovascular hypertension (no meds), Normal cardiovascular exam     Neuro/Psych  negative psych ROS   GI/Hepatic negative GI ROS, Neg liver ROS,,,  Endo/Other    Class 3 obesity  Renal/GU CRFRenal disease     Musculoskeletal  (+) Arthritis  (gout),    Abdominal   Peds  Hematology negative hematology ROS (+)   Anesthesia Other Findings   Reproductive/Obstetrics                              Anesthesia Physical Anesthesia Plan  ASA: 3  Anesthesia Plan: General   Post-op Pain Management: Tylenol  PO (pre-op)*   Induction: Intravenous  PONV Risk Score and Plan: 2 and Treatment may vary due to age or medical condition, Ondansetron , Dexamethasone  and Midazolam  Airway Management Planned: Oral ETT and Video Laryngoscope Planned  Additional Equipment: None  Intra-op Plan:   Post-operative Plan: Extubation in OR  Informed Consent: I have reviewed the patients History and Physical, chart, labs and discussed the procedure including the risks, benefits and alternatives for the proposed anesthesia with the patient or authorized representative who has indicated his/her understanding and acceptance.     Dental advisory given  Plan Discussed with: CRNA and Anesthesiologist  Anesthesia Plan Comments: (PAT note written 10/22/2023 by Allison Zelenak, PA-C.  )         Anesthesia Quick Evaluation

## 2023-10-28 ENCOUNTER — Inpatient Hospital Stay (HOSPITAL_COMMUNITY)

## 2023-10-28 ENCOUNTER — Inpatient Hospital Stay (HOSPITAL_COMMUNITY): Payer: Self-pay | Admitting: Vascular Surgery

## 2023-10-28 ENCOUNTER — Encounter (HOSPITAL_COMMUNITY): Payer: Self-pay | Admitting: Neurological Surgery

## 2023-10-28 ENCOUNTER — Other Ambulatory Visit: Payer: Self-pay

## 2023-10-28 ENCOUNTER — Inpatient Hospital Stay (HOSPITAL_COMMUNITY): Payer: Self-pay | Admitting: Anesthesiology

## 2023-10-28 ENCOUNTER — Inpatient Hospital Stay (HOSPITAL_COMMUNITY)
Admission: RE | Disposition: A | Discharge status: Home / Self Care | Payer: Self-pay | Source: Home / Self Care | Attending: Neurological Surgery

## 2023-10-28 ENCOUNTER — Inpatient Hospital Stay (HOSPITAL_COMMUNITY)
Admission: RE | Admit: 2023-10-28 | Discharge: 2023-11-05 | DRG: 472 | Disposition: A | Discharge status: Home / Self Care | Attending: Neurological Surgery | Admitting: Neurological Surgery

## 2023-10-28 DIAGNOSIS — R338 Other retention of urine: Secondary | ICD-10-CM | POA: Diagnosis not present

## 2023-10-28 DIAGNOSIS — Z8249 Family history of ischemic heart disease and other diseases of the circulatory system: Secondary | ICD-10-CM

## 2023-10-28 DIAGNOSIS — M488X2 Other specified spondylopathies, cervical region: Secondary | ICD-10-CM | POA: Diagnosis present

## 2023-10-28 DIAGNOSIS — X58XXXA Exposure to other specified factors, initial encounter: Secondary | ICD-10-CM | POA: Diagnosis not present

## 2023-10-28 DIAGNOSIS — R509 Fever, unspecified: Secondary | ICD-10-CM | POA: Diagnosis not present

## 2023-10-28 DIAGNOSIS — E785 Hyperlipidemia, unspecified: Secondary | ICD-10-CM | POA: Diagnosis present

## 2023-10-28 DIAGNOSIS — N35819 Other urethral stricture, male, unspecified site: Secondary | ICD-10-CM | POA: Diagnosis present

## 2023-10-28 DIAGNOSIS — Z792 Long term (current) use of antibiotics: Secondary | ICD-10-CM | POA: Diagnosis not present

## 2023-10-28 DIAGNOSIS — R319 Hematuria, unspecified: Secondary | ICD-10-CM | POA: Diagnosis not present

## 2023-10-28 DIAGNOSIS — G959 Disease of spinal cord, unspecified: Secondary | ICD-10-CM | POA: Diagnosis present

## 2023-10-28 DIAGNOSIS — N400 Enlarged prostate without lower urinary tract symptoms: Secondary | ICD-10-CM | POA: Diagnosis present

## 2023-10-28 DIAGNOSIS — K5900 Constipation, unspecified: Secondary | ICD-10-CM | POA: Diagnosis not present

## 2023-10-28 DIAGNOSIS — M4722 Other spondylosis with radiculopathy, cervical region: Secondary | ICD-10-CM | POA: Diagnosis present

## 2023-10-28 DIAGNOSIS — E669 Obesity, unspecified: Secondary | ICD-10-CM | POA: Diagnosis present

## 2023-10-28 DIAGNOSIS — Z6841 Body Mass Index (BMI) 40.0 and over, adult: Secondary | ICD-10-CM | POA: Diagnosis not present

## 2023-10-28 DIAGNOSIS — S3730XA Unspecified injury of urethra, initial encounter: Secondary | ICD-10-CM | POA: Diagnosis not present

## 2023-10-28 DIAGNOSIS — M4802 Spinal stenosis, cervical region: Secondary | ICD-10-CM

## 2023-10-28 DIAGNOSIS — Z79899 Other long term (current) drug therapy: Secondary | ICD-10-CM | POA: Diagnosis not present

## 2023-10-28 DIAGNOSIS — Z87891 Personal history of nicotine dependence: Secondary | ICD-10-CM | POA: Diagnosis not present

## 2023-10-28 DIAGNOSIS — M109 Gout, unspecified: Secondary | ICD-10-CM | POA: Diagnosis present

## 2023-10-28 DIAGNOSIS — N183 Chronic kidney disease, stage 3 unspecified: Secondary | ICD-10-CM | POA: Diagnosis not present

## 2023-10-28 DIAGNOSIS — I129 Hypertensive chronic kidney disease with stage 1 through stage 4 chronic kidney disease, or unspecified chronic kidney disease: Secondary | ICD-10-CM

## 2023-10-28 DIAGNOSIS — M4712 Other spondylosis with myelopathy, cervical region: Principal | ICD-10-CM | POA: Diagnosis present

## 2023-10-28 DIAGNOSIS — R0789 Other chest pain: Secondary | ICD-10-CM | POA: Diagnosis not present

## 2023-10-28 DIAGNOSIS — Z7409 Other reduced mobility: Secondary | ICD-10-CM | POA: Diagnosis not present

## 2023-10-28 DIAGNOSIS — N179 Acute kidney failure, unspecified: Secondary | ICD-10-CM | POA: Diagnosis not present

## 2023-10-28 DIAGNOSIS — M19012 Primary osteoarthritis, left shoulder: Secondary | ICD-10-CM | POA: Diagnosis present

## 2023-10-28 DIAGNOSIS — I10 Essential (primary) hypertension: Secondary | ICD-10-CM | POA: Diagnosis present

## 2023-10-28 HISTORY — PX: POSTERIOR CERVICAL LAMINECTOMY: SHX2248

## 2023-10-28 LAB — ABO/RH: ABO/RH(D): A POS

## 2023-10-28 SURGERY — POSTERIOR CERVICAL LAMINECTOMY
Anesthesia: General

## 2023-10-28 MED ORDER — LIDOCAINE 2% (20 MG/ML) 5 ML SYRINGE
INTRAMUSCULAR | Status: DC | PRN
  Administered 2023-10-28: 100 mg via INTRAVENOUS

## 2023-10-28 MED ORDER — SUGAMMADEX SODIUM 200 MG/2ML IV SOLN
INTRAVENOUS | Status: DC | PRN
  Administered 2023-10-28: 100 mg via INTRAVENOUS

## 2023-10-28 MED ORDER — MENTHOL 3 MG MT LOZG: 1.0000 | LOZENGE | OROMUCOSAL | Status: DC | PRN

## 2023-10-28 MED ORDER — SODIUM CHLORIDE 0.9% FLUSH
3.0000 mL | Freq: Two times a day (BID) | INTRAVENOUS | Status: DC
  Administered 2023-10-28 – 2023-11-04 (×13): 3 mL via INTRAVENOUS

## 2023-10-28 MED ORDER — EPHEDRINE SULFATE-NACL 50-0.9 MG/10ML-% IV SOSY
PREFILLED_SYRINGE | INTRAVENOUS | Status: DC | PRN
  Administered 2023-10-28 (×4): 5 mg via INTRAVENOUS

## 2023-10-28 MED ORDER — KETAMINE HCL 50 MG/5ML IJ SOSY
PREFILLED_SYRINGE | INTRAMUSCULAR | Status: AC
  Filled 2023-10-28: qty 5

## 2023-10-28 MED ORDER — THROMBIN 5000 UNITS EX KIT
PACK | CUTANEOUS | Status: AC
  Filled 2023-10-28: qty 1

## 2023-10-28 MED ORDER — OXYCODONE HCL 5 MG PO TABS: 5.0000 mg | ORAL_TABLET | Freq: Once | ORAL | Status: DC | PRN

## 2023-10-28 MED ORDER — DEXAMETHASONE SODIUM PHOSPHATE 10 MG/ML IJ SOLN
INTRAMUSCULAR | Status: DC | PRN
  Administered 2023-10-28: 10 mg via INTRAVENOUS

## 2023-10-28 MED ORDER — METHYLPREDNISOLONE ACETATE 80 MG/ML IJ SUSP
INTRAMUSCULAR | Status: AC
  Filled 2023-10-28: qty 1

## 2023-10-28 MED ORDER — ONDANSETRON HCL 4 MG PO TABS: 4.0000 mg | ORAL_TABLET | Freq: Four times a day (QID) | ORAL | Status: DC | PRN

## 2023-10-28 MED ORDER — ACETAMINOPHEN 650 MG RE SUPP: 650.0000 mg | RECTAL | Status: DC | PRN

## 2023-10-28 MED ORDER — 0.9 % SODIUM CHLORIDE (POUR BTL) OPTIME
TOPICAL | Status: DC | PRN
  Administered 2023-10-28: 1000 mL

## 2023-10-28 MED ORDER — ONDANSETRON HCL 4 MG/2ML IJ SOLN
INTRAMUSCULAR | Status: AC
  Filled 2023-10-28: qty 2

## 2023-10-28 MED ORDER — DEXMEDETOMIDINE HCL IN NACL 80 MCG/20ML IV SOLN
INTRAVENOUS | Status: AC
  Filled 2023-10-28: qty 20

## 2023-10-28 MED ORDER — FENTANYL CITRATE (PF) 250 MCG/5ML IJ SOLN
INTRAMUSCULAR | Status: AC
  Filled 2023-10-28: qty 5

## 2023-10-28 MED ORDER — SODIUM CHLORIDE 0.9 % IV SOLN: 250.0000 mL | INTRAVENOUS | Status: AC

## 2023-10-28 MED ORDER — ESMOLOL HCL 100 MG/10ML IV SOLN
INTRAVENOUS | Status: DC | PRN
Start: 2023-10-28 — End: 2023-10-28
  Administered 2023-10-28: 20 mg via INTRAVENOUS

## 2023-10-28 MED ORDER — PROPOFOL 10 MG/ML IV BOLUS
INTRAVENOUS | Status: DC | PRN
Start: 2023-10-28 — End: 2023-10-28
  Administered 2023-10-28: 140 mg via INTRAVENOUS
  Administered 2023-10-28: 20 mg via INTRAVENOUS

## 2023-10-28 MED ORDER — STERILE WATER FOR IRRIGATION IR SOLN
Status: DC | PRN
  Administered 2023-10-28 (×3): 1000 mL

## 2023-10-28 MED ORDER — FENTANYL CITRATE (PF) 250 MCG/5ML IJ SOLN
INTRAMUSCULAR | Status: DC | PRN
  Administered 2023-10-28 (×2): 50 ug via INTRAVENOUS
  Administered 2023-10-28: 100 ug via INTRAVENOUS
  Administered 2023-10-28: 50 ug via INTRAVENOUS

## 2023-10-28 MED ORDER — CEFAZOLIN SODIUM-DEXTROSE 2-4 GM/100ML-% IV SOLN
2.0000 g | Freq: Three times a day (TID) | INTRAVENOUS | Status: AC
  Administered 2023-10-28 – 2023-10-29 (×2): 2 g via INTRAVENOUS
  Filled 2023-10-28 (×2): qty 100

## 2023-10-28 MED ORDER — FENTANYL CITRATE (PF) 100 MCG/2ML IJ SOLN
INTRAMUSCULAR | Status: AC
  Filled 2023-10-28: qty 2

## 2023-10-28 MED ORDER — LIDOCAINE-EPINEPHRINE 1 %-1:100000 IJ SOLN
INTRAMUSCULAR | Status: AC
  Filled 2023-10-28: qty 1

## 2023-10-28 MED ORDER — BUPIVACAINE HCL (PF) 0.5 % IJ SOLN
INTRAMUSCULAR | Status: DC | PRN
  Administered 2023-10-28: 10 mL

## 2023-10-28 MED ORDER — DEXAMETHASONE SODIUM PHOSPHATE 10 MG/ML IJ SOLN
INTRAMUSCULAR | Status: AC
Start: 2023-10-28 — End: 2023-10-28
  Filled 2023-10-28: qty 1

## 2023-10-28 MED ORDER — SODIUM CHLORIDE 0.9% FLUSH: 3.0000 mL | INTRAVENOUS | Status: DC | PRN

## 2023-10-28 MED ORDER — FLEET ENEMA RE ENEM: 1.0000 | ENEMA | Freq: Once | RECTAL | Status: DC | PRN

## 2023-10-28 MED ORDER — POLYETHYLENE GLYCOL 3350 17 G PO PACK
17.0000 g | PACK | Freq: Every day | ORAL | Status: DC | PRN
  Administered 2023-11-03: 17 g via ORAL
  Filled 2023-10-28 (×2): qty 1

## 2023-10-28 MED ORDER — CEFAZOLIN SODIUM-DEXTROSE 3-4 GM/150ML-% IV SOLN
3.0000 g | INTRAVENOUS | Status: AC
  Administered 2023-10-28: 3 g via INTRAVENOUS
  Filled 2023-10-28: qty 150

## 2023-10-28 MED ORDER — OXYCODONE HCL 5 MG/5ML PO SOLN: 5.0000 mg | Freq: Once | ORAL | Status: DC | PRN

## 2023-10-28 MED ORDER — LIDOCAINE-EPINEPHRINE 1 %-1:100000 IJ SOLN
INTRAMUSCULAR | Status: DC | PRN
  Administered 2023-10-28: 10 mL

## 2023-10-28 MED ORDER — CHLORHEXIDINE GLUCONATE CLOTH 2 % EX PADS: 6.0000 | MEDICATED_PAD | Freq: Once | CUTANEOUS | Status: DC

## 2023-10-28 MED ORDER — LIDOCAINE 2% (20 MG/ML) 5 ML SYRINGE: INTRAMUSCULAR | Status: DC | PRN

## 2023-10-28 MED ORDER — CHLORHEXIDINE GLUCONATE 0.12 % MT SOLN
15.0000 mL | Freq: Once | OROMUCOSAL | Status: AC
  Administered 2023-10-28: 15 mL via OROMUCOSAL
  Filled 2023-10-28: qty 15

## 2023-10-28 MED ORDER — PHENYLEPHRINE HCL-NACL 20-0.9 MG/250ML-% IV SOLN
INTRAVENOUS | Status: DC | PRN
  Administered 2023-10-28: 50 ug/min via INTRAVENOUS

## 2023-10-28 MED ORDER — ACETAMINOPHEN 500 MG PO TABS
1000.0000 mg | ORAL_TABLET | Freq: Once | ORAL | Status: AC
  Administered 2023-10-28: 1000 mg via ORAL
  Filled 2023-10-28: qty 2

## 2023-10-28 MED ORDER — POVIDONE-IODINE 10 % EX OINT
TOPICAL_OINTMENT | CUTANEOUS | Status: AC
  Filled 2023-10-28: qty 28.35

## 2023-10-28 MED ORDER — BACITRACIN ZINC 500 UNIT/GM EX OINT
TOPICAL_OINTMENT | CUTANEOUS | Status: DC | PRN
Start: 2023-10-28 — End: 2023-10-28
  Administered 2023-10-28: 1 via TOPICAL

## 2023-10-28 MED ORDER — THROMBIN 5000 UNITS EX SOLR
OROMUCOSAL | Status: DC | PRN
  Administered 2023-10-28 (×3): 5 mL via TOPICAL

## 2023-10-28 MED ORDER — KETAMINE HCL 50 MG/5ML IJ SOSY
PREFILLED_SYRINGE | INTRAMUSCULAR | Status: DC | PRN
  Administered 2023-10-28: 10 mg via INTRAVENOUS
  Administered 2023-10-28: 15 mg via INTRAVENOUS
  Administered 2023-10-28: 25 mg via INTRAVENOUS

## 2023-10-28 MED ORDER — ROCURONIUM BROMIDE 10 MG/ML (PF) SYRINGE
PREFILLED_SYRINGE | INTRAVENOUS | Status: DC | PRN
  Administered 2023-10-28: 20 mg via INTRAVENOUS
  Administered 2023-10-28: 30 mg via INTRAVENOUS
  Administered 2023-10-28: 60 mg via INTRAVENOUS
  Administered 2023-10-28 (×4): 20 mg via INTRAVENOUS

## 2023-10-28 MED ORDER — HEMOSTATIC AGENTS (NO CHARGE) OPTIME
TOPICAL | Status: DC | PRN
  Administered 2023-10-28: 1 via TOPICAL

## 2023-10-28 MED ORDER — HYDROMORPHONE HCL 1 MG/ML IJ SOLN
1.0000 mg | INTRAMUSCULAR | Status: DC | PRN
  Administered 2023-10-28 – 2023-11-02 (×7): 1 mg via INTRAVENOUS
  Filled 2023-10-28 (×10): qty 1

## 2023-10-28 MED ORDER — ONDANSETRON HCL 4 MG/2ML IJ SOLN: 4.0000 mg | Freq: Four times a day (QID) | INTRAMUSCULAR | Status: DC | PRN

## 2023-10-28 MED ORDER — SENNA 8.6 MG PO TABS
1.0000 | ORAL_TABLET | Freq: Two times a day (BID) | ORAL | Status: DC
  Administered 2023-10-28 – 2023-11-05 (×16): 8.6 mg via ORAL
  Filled 2023-10-28 (×16): qty 1

## 2023-10-28 MED ORDER — ALBUMIN HUMAN 5 % IV SOLN: INTRAVENOUS | Status: DC | PRN

## 2023-10-28 MED ORDER — ORAL CARE MOUTH RINSE: 15.0000 mL | Freq: Once | OROMUCOSAL | Status: AC

## 2023-10-28 MED ORDER — OXYCODONE-ACETAMINOPHEN 5-325 MG PO TABS
1.0000 | ORAL_TABLET | Freq: Four times a day (QID) | ORAL | Status: DC | PRN
  Administered 2023-10-28 – 2023-10-29 (×4): 1 via ORAL
  Administered 2023-10-30 – 2023-11-01 (×8): 2 via ORAL
  Filled 2023-10-28: qty 1
  Filled 2023-10-28 (×6): qty 2
  Filled 2023-10-28: qty 1
  Filled 2023-10-28 (×2): qty 2
  Filled 2023-10-28 (×2): qty 1

## 2023-10-28 MED ORDER — ESMOLOL HCL 100 MG/10ML IV SOLN
INTRAVENOUS | Status: AC
  Filled 2023-10-28: qty 10

## 2023-10-28 MED ORDER — BISACODYL 10 MG RE SUPP: 10.0000 mg | Freq: Every day | RECTAL | Status: DC | PRN

## 2023-10-28 MED ORDER — ROCURONIUM BROMIDE 10 MG/ML (PF) SYRINGE
PREFILLED_SYRINGE | INTRAVENOUS | Status: AC
  Filled 2023-10-28: qty 10

## 2023-10-28 MED ORDER — PHENOL 1.4 % MT LIQD: 1.0000 | OROMUCOSAL | Status: DC | PRN

## 2023-10-28 MED ORDER — DOCUSATE SODIUM 100 MG PO CAPS
100.0000 mg | ORAL_CAPSULE | Freq: Two times a day (BID) | ORAL | Status: DC
  Administered 2023-10-28 – 2023-11-05 (×16): 100 mg via ORAL
  Filled 2023-10-28 (×17): qty 1

## 2023-10-28 MED ORDER — PHENYLEPHRINE 80 MCG/ML (10ML) SYRINGE FOR IV PUSH (FOR BLOOD PRESSURE SUPPORT)
PREFILLED_SYRINGE | INTRAVENOUS | Status: DC | PRN
  Administered 2023-10-28 (×3): 160 ug via INTRAVENOUS

## 2023-10-28 MED ORDER — MIDAZOLAM HCL 2 MG/2ML IJ SOLN
INTRAMUSCULAR | Status: AC
  Filled 2023-10-28: qty 2

## 2023-10-28 MED ORDER — EPHEDRINE 5 MG/ML INJ
INTRAVENOUS | Status: AC
  Filled 2023-10-28: qty 5

## 2023-10-28 MED ORDER — METHOCARBAMOL 500 MG PO TABS
500.0000 mg | ORAL_TABLET | Freq: Four times a day (QID) | ORAL | Status: DC | PRN
  Administered 2023-10-28 – 2023-10-31 (×5): 500 mg via ORAL
  Filled 2023-10-28 (×5): qty 1

## 2023-10-28 MED ORDER — MIDAZOLAM HCL 2 MG/2ML IJ SOLN
INTRAMUSCULAR | Status: DC | PRN
  Administered 2023-10-28: 2 mg via INTRAVENOUS

## 2023-10-28 MED ORDER — METHOCARBAMOL 1000 MG/10ML IJ SOLN
500.0000 mg | Freq: Four times a day (QID) | INTRAMUSCULAR | Status: DC | PRN
  Administered 2023-10-29 – 2023-10-31 (×3): 500 mg via INTRAVENOUS
  Filled 2023-10-28 (×3): qty 10

## 2023-10-28 MED ORDER — TAMSULOSIN HCL 0.4 MG PO CAPS
0.4000 mg | ORAL_CAPSULE | Freq: Every morning | ORAL | Status: DC
  Administered 2023-10-29 – 2023-11-05 (×8): 0.4 mg via ORAL
  Filled 2023-10-28 (×8): qty 1

## 2023-10-28 MED ORDER — PROPOFOL 10 MG/ML IV BOLUS
INTRAVENOUS | Status: AC
  Filled 2023-10-28: qty 20

## 2023-10-28 MED ORDER — THROMBIN 5000 UNITS EX KIT
PACK | CUTANEOUS | Status: AC
Start: 2023-10-28 — End: 2023-10-28
  Filled 2023-10-28: qty 1

## 2023-10-28 MED ORDER — BUPIVACAINE HCL (PF) 0.5 % IJ SOLN
INTRAMUSCULAR | Status: AC
  Filled 2023-10-28: qty 30

## 2023-10-28 MED ORDER — BACITRACIN ZINC 500 UNIT/GM EX OINT
TOPICAL_OINTMENT | CUTANEOUS | Status: AC
  Filled 2023-10-28: qty 28.35

## 2023-10-28 MED ORDER — LIDOCAINE 2% (20 MG/ML) 5 ML SYRINGE
INTRAMUSCULAR | Status: AC
  Filled 2023-10-28: qty 5

## 2023-10-28 MED ORDER — ROSUVASTATIN CALCIUM 20 MG PO TABS
20.0000 mg | ORAL_TABLET | Freq: Every morning | ORAL | Status: DC
  Administered 2023-10-29 – 2023-11-05 (×8): 20 mg via ORAL
  Filled 2023-10-28 (×8): qty 1

## 2023-10-28 MED ORDER — LACTATED RINGERS IV SOLN: INTRAVENOUS | Status: DC

## 2023-10-28 MED ORDER — ONDANSETRON HCL 4 MG/2ML IJ SOLN: 4.0000 mg | Freq: Once | INTRAMUSCULAR | Status: DC | PRN

## 2023-10-28 MED ORDER — HYDROMORPHONE HCL 1 MG/ML IJ SOLN
INTRAMUSCULAR | Status: AC
  Filled 2023-10-28: qty 1

## 2023-10-28 MED ORDER — ACETAMINOPHEN 325 MG PO TABS
650.0000 mg | ORAL_TABLET | ORAL | Status: DC | PRN
  Administered 2023-10-29 – 2023-10-31 (×3): 650 mg via ORAL
  Filled 2023-10-28 (×3): qty 2

## 2023-10-28 MED ORDER — ALLOPURINOL 300 MG PO TABS
300.0000 mg | ORAL_TABLET | Freq: Every morning | ORAL | Status: DC
  Administered 2023-10-29 – 2023-11-05 (×8): 300 mg via ORAL
  Filled 2023-10-28 (×8): qty 1

## 2023-10-28 MED ORDER — HYDROMORPHONE HCL 1 MG/ML IJ SOLN
0.2500 mg | INTRAMUSCULAR | Status: DC | PRN
  Administered 2023-10-28: 0.5 mg via INTRAVENOUS

## 2023-10-28 MED ORDER — ONDANSETRON HCL 4 MG/2ML IJ SOLN
INTRAMUSCULAR | Status: DC | PRN
  Administered 2023-10-28: 4 mg via INTRAVENOUS

## 2023-10-28 MED ORDER — FENTANYL CITRATE (PF) 100 MCG/2ML IJ SOLN
25.0000 ug | INTRAMUSCULAR | Status: DC | PRN
  Administered 2023-10-28 (×3): 50 ug via INTRAVENOUS

## 2023-10-28 SURGICAL SUPPLY — 69 items
BAG COUNTER SPONGE SURGICOUNT (BAG) ×1 IMPLANT
BAND RUBBER #18 3X1/16 STRL (MISCELLANEOUS) ×2 IMPLANT
BASKET BONE COLLECTION (BASKET) IMPLANT
BENZOIN TINCTURE PRP APPL 2/3 (GAUZE/BANDAGES/DRESSINGS) IMPLANT
BIT DRILL NEURO 2X3.1 SFT TUCH (MISCELLANEOUS) ×1 IMPLANT
BIT DRILL NS LF DISP RELINE C (BIT) IMPLANT
BLADE BONE MILL MEDIUM (MISCELLANEOUS) IMPLANT
BLADE CLIPPER SURG (BLADE) IMPLANT
BLADE SURG 11 STRL SS (BLADE) ×1 IMPLANT
CANISTER SUCTION 3000ML PPV (SUCTIONS) ×1 IMPLANT
CATH COUDE FOLEY 5CC 14FR (CATHETERS) IMPLANT
CATH FOLEY 2W COUNCIL 20FR 5CC (CATHETERS) IMPLANT
COVER MAYO STAND STRL (DRAPES) IMPLANT
DERMABOND ADVANCED .7 DNX12 (GAUZE/BANDAGES/DRESSINGS) IMPLANT
DEVICE DISSECT PLASMABLAD 3.0S (MISCELLANEOUS) ×1 IMPLANT
DRAPE HALF SHEET 40X57 (DRAPES) IMPLANT
DRAPE LAPAROTOMY 100X72 PEDS (DRAPES) ×1 IMPLANT
DURAPREP 26ML APPLICATOR (WOUND CARE) ×1 IMPLANT
ELECT BLADE 6.5 EXT (BLADE) ×1 IMPLANT
ELECTRODE REM PT RTRN 9FT ADLT (ELECTROSURGICAL) ×1 IMPLANT
EVACUATOR 3/16 PVC DRAIN (DRAIN) IMPLANT
GAUZE 4X4 16PLY ~~LOC~~+RFID DBL (SPONGE) IMPLANT
GAUZE SPONGE 4X4 12PLY STRL (GAUZE/BANDAGES/DRESSINGS) IMPLANT
GLOVE BIOGEL PI IND STRL 8.5 (GLOVE) ×1 IMPLANT
GLOVE ECLIPSE 8.5 STRL (GLOVE) ×1 IMPLANT
GLOVE EXAM NITRILE XL STR (GLOVE) IMPLANT
GOWN STRL REUS W/ TWL LRG LVL3 (GOWN DISPOSABLE) IMPLANT
GOWN STRL REUS W/ TWL XL LVL3 (GOWN DISPOSABLE) ×1 IMPLANT
GOWN STRL REUS W/TWL 2XL LVL3 (GOWN DISPOSABLE) ×1 IMPLANT
GRAFT DURAGEN MATRIX 1WX1L (Tissue) IMPLANT
HEMOSTAT POWDER KIT SURGIFOAM (HEMOSTASIS) ×1 IMPLANT
HEMOSTAT SURGICEL 2X14 (HEMOSTASIS) ×1 IMPLANT
KIT BASIN OR (CUSTOM PROCEDURE TRAY) ×1 IMPLANT
KIT TURNOVER KIT B (KITS) ×1 IMPLANT
MILL BONE PREP (MISCELLANEOUS) IMPLANT
NDL HYPO 18GX1.5 BLUNT FILL (NEEDLE) IMPLANT
NDL HYPO 22X1.5 SAFETY MO (MISCELLANEOUS) ×1 IMPLANT
NEEDLE HYPO 18GX1.5 BLUNT FILL (NEEDLE) IMPLANT
NEEDLE HYPO 22X1.5 SAFETY MO (MISCELLANEOUS) ×1 IMPLANT
PACK LAMINECTOMY NEURO (CUSTOM PROCEDURE TRAY) ×1 IMPLANT
PAD ARMBOARD POSITIONER FOAM (MISCELLANEOUS) ×3 IMPLANT
PATTIES SURGICAL .5 X.5 (GAUZE/BANDAGES/DRESSINGS) IMPLANT
PATTIES SURGICAL .5 X1 (DISPOSABLE) IMPLANT
PATTIES SURGICAL 1X1 (DISPOSABLE) IMPLANT
PIN MAYFIELD SKULL DISP (PIN) ×1 IMPLANT
ROD PB RELINE C 3.5X80 (Rod) IMPLANT
SCREW LOCK RELINE C OPEN (Screw) IMPLANT
SCREW SP MA RELINE-C 3.5X12 (Screw) IMPLANT
SOLN 0.9% NACL 1000 ML (IV SOLUTION) ×1 IMPLANT
SOLN 0.9% NACL POUR BTL 1000ML (IV SOLUTION) ×1 IMPLANT
SOLN STERILE WATER 1000 ML (IV SOLUTION) ×1 IMPLANT
SOLN STERILE WATER BTL 1000 ML (IV SOLUTION) ×1 IMPLANT
SPIKE FLUID TRANSFER (MISCELLANEOUS) ×1 IMPLANT
SPONGE SURGIFOAM ABS GEL SZ50 (HEMOSTASIS) IMPLANT
SPONGE T-LAP 4X18 ~~LOC~~+RFID (SPONGE) IMPLANT
STAPLER SKIN PROX 35W (STAPLE) ×1 IMPLANT
STRIP CLOSURE SKIN 1/2X4 (GAUZE/BANDAGES/DRESSINGS) IMPLANT
SUT 3-0 BLK 1X30 PSL (SUTURE) IMPLANT
SUT PROLENE 6 0 BV (SUTURE) IMPLANT
SUT VIC AB 1 CT1 18XBRD ANBCTR (SUTURE) ×1 IMPLANT
SUT VIC AB 2-0 CP2 18 (SUTURE) ×1 IMPLANT
SUT VIC AB 3-0 SH 8-18 (SUTURE) ×1 IMPLANT
SUT VIC AB 4-0 RB1 18 (SUTURE) ×1 IMPLANT
SYR 3ML LL SCALE MARK (SYRINGE) IMPLANT
TAPE CLOTH SURG 4X10 WHT LF (GAUZE/BANDAGES/DRESSINGS) IMPLANT
TOWEL GREEN STERILE (TOWEL DISPOSABLE) ×1 IMPLANT
TOWEL GREEN STERILE FF (TOWEL DISPOSABLE) ×1 IMPLANT
TRAY FOLEY MTR SLVR 16FR STAT (SET/KITS/TRAYS/PACK) IMPLANT
TUBING FEATHERFLOW (TUBING) ×1 IMPLANT

## 2023-10-28 NOTE — Procedures (Signed)
   Urology Procedure Note:  Procedure: flexible cysto bedside, urethral dilation  Will formally consult tomorrow once patient has recovered from anesthesia.  Call to the OR for difficult catheter placement in advance of spinal fixation.  Patient was prepped and draped in usual sterile fashion.  Being that the nurses and providers in the room and already attempted catheters including coud, I have elected to start with a zip wire.  I was not able to advance the catheter and decided to proceed with cystoscopy.  There was ragged tissue throughout the proximal portion of the urethra, dead- ending into the bulbar urethra with a significant quantity of blood clot and ragged tissue.  Throughout the urethra was not able to visualize the true lumen and suspected that it was sitting behind this obstructing field of tissue and clot material.  I attempted to extract this with graspers but all available sets were malfunctioning.  I gently teased the tissue and clot material off of the back of the urethra, slowly gaining visualization.  A zip wire was fed back through the scope and used to snare the remaining material.  Removing the scope allowed the remainder of the obstruction to be irrigated from the urethra.  At this point I was able to visualize several small pinholes at the base of the urethra which were gently probed with a sensor wire.  Finding what I felt to be the true lumen I advanced a 5 [place] open ureteral catheter over wire and was able to confirm placement with brisk drip.  Placement was also confirmed using ultrasound.  At this point I began to serially dilate beginning at 10 French and moving up to 24 [place].  I then advanced a 25f council tip catheter using Seldinger technique over wire and offloaded a sensor wire.  Confirming placement, the retention balloon was inflated with 10 cc of sterile water .  Catheter was placed to gravity drainage, concluding the procedure.  Clear blood-tinged urine was  returned, around 800 mL.  Disposition: Updated wife in the waiting room.  He will discharge home with his catheter and schedule voiding trial with [facility] no sooner than 7 days.  He is already established patient with Dr. Watt.  Please feel free to call with questions.   Dale Bourdon, NP [facility] Pager: [phone]

## 2023-10-28 NOTE — Progress Notes (Signed)
 Cystoscopy with urethral dilation and foley insertion by Ole Bourdon performed before patient woken from anesthesia. Time out performed at 1400 with Ole Bourdon, NP, Thedora Hamming, RN, Bari Louder, RN, and Alfonzo Model, CRNA. One attempt at 16 fr latex by Thedora Hamming, RN. One attempt at 14 fr coude by Bari Louder, RN and one attempt with same catheter by Dr. Colon.

## 2023-10-28 NOTE — H&P (Signed)
 Dale Green is an 68 y.o. male.   Chief Complaint: Neck shoulder and arm pain weakness HPI: The patient is a 68 year old individual whose had symptoms of progressive neck shoulder and arm discomfort.  He also notes some general stability issues on his lower extremities.  An MRI of the cervical spine demonstrates that he has advanced spondylitic stenosis at multiple levels starting from C3 down to C7.  A CT scan confirmed the presence of severely ossified ligament in the posterior spine.  He was advised regarding surgery to include laminectomy from C3-C7 with posterior fixation.  He is now admitted to undergo this procedure.  Past Medical History:  Diagnosis Date   BPH (benign prostatic hyperplasia)    Chronic kidney disease    Gout    Hyperlipidemia    Hypertension    Osteoarthritis     Past Surgical History:  Procedure Laterality Date   BIOPSY  05/29/2020   Procedure: BIOPSY;  Surgeon: Eartha Angelia Sieving, MD;  [facility];  Service: Gastroenterology;;   COLONOSCOPY WITH PROPOFOL  N/A 05/29/2020   Procedure: COLONOSCOPY WITH PROPOFOL ;  Surgeon: Eartha Angelia Sieving, MD;  [facility];  Service: Gastroenterology;  Laterality: N/A;  11:00   POLYPECTOMY  05/29/2020   Procedure: POLYPECTOMY;  Surgeon: Eartha Angelia Sieving, MD;  [facility];  Service: Gastroenterology;;   TOOTH EXTRACTION     ureathral surgery  1970    Family History  Problem Relation Age of Onset   Healthy Mother    Heart disease Father    Healthy Brother    Social History:  reports that he has quit smoking. He has never used smokeless tobacco. He reports that he does not drink alcohol and does not use drugs.  Allergies: No Known Allergies  Medications Prior to Admission  Medication Sig Dispense Refill   allopurinol  (ZYLOPRIM ) 300 MG tablet Take 300 mg by mouth in the morning.  0   Cholecalciferol (VITAMIN D3 PO) Take 5,000 Units by mouth in the morning.      Flaxseed, Linseed, (FLAXSEED OIL PO) Take 1,000 mg by mouth in the morning.     Methylsulfonylmethane (MSM PO) Take 1,000 mg by mouth in the morning.     penicillin v potassium (VEETID) 500 MG tablet Take 500 mg by mouth 4 (four) times daily.     rosuvastatin (CRESTOR) 20 MG tablet Take 20 mg by mouth in the morning.     tamsulosin (FLOMAX) 0.4 MG CAPS capsule Take 0.4 mg by mouth in the morning.      No results found for this or any previous visit (from the past 48 hours). No results found.  Review of Systems  Constitutional:  Positive for activity change.  Musculoskeletal:  Positive for myalgias, neck pain and neck stiffness.  Neurological:  Positive for weakness and numbness.  All other systems reviewed and are negative.   Blood pressure (!) 169/81, pulse 74, temperature 97.7 F (36.5 C), temperature source Oral, resp. rate 17, height 5' 10 (1.778 m), weight 130.6 kg, SpO2 96%. Physical Exam Constitutional:      Appearance: Normal appearance. He is obese.  HENT:     Head: Normocephalic and atraumatic.     Right Ear: Tympanic membrane, ear canal and external ear normal.     Left Ear: Tympanic membrane, ear canal and external ear normal.     Nose: Nose normal.     Mouth/Throat:     Mouth: Mucous membranes are moist.  Pharynx: Oropharynx is clear.  Eyes:     Extraocular Movements: Extraocular movements intact.     Conjunctiva/sclera: Conjunctivae normal.     Pupils: Pupils are equal, round, and reactive to light.  Neck:     Comments: Decreased range of motion turning 30 degrees left and right flexion extension less than 50% of normal.  Positive Spurling maneuver. Cardiovascular:     Rate and Rhythm: Normal rate and regular rhythm.     Pulses: Normal pulses.     Heart sounds: Normal heart sounds.  Pulmonary:     Effort: Pulmonary effort is normal.     Breath sounds: Normal breath sounds.  Abdominal:     General: Abdomen is flat. Bowel sounds are normal.     Palpations:  Abdomen is soft.  Musculoskeletal:        General: Normal range of motion.  Skin:    General: Skin is warm and dry.     Capillary Refill: Capillary refill takes less than 2 seconds.  Neurological:     Mental Status: He is alert.     Comments: Mildly wide-based gait.  Positive Romberg's test.  Upper extremities reveal good strength tone and bulk but patient complains of dysesthetic sensations into the distal fingertips.  Psychiatric:        Mood and Affect: Mood normal.        Behavior: Behavior normal.        Thought Content: Thought content normal.        Judgment: Judgment normal.      Assessment/Plan Cervical spondylosis with myelopathy and radiculopathy C3-4 C4-5 C5-6 and C6-C7.  Plan: Posterior cervical decompression C3-C7 with posterolateral arthrodesis using facet fixation and local autograft.  Victory JINNY Gens, MD 10/28/2023, 7:55 AM

## 2023-10-28 NOTE — Anesthesia Postprocedure Evaluation (Signed)
 Anesthesia Post Note  Patient: Dale Green  Procedure(s) Performed: Posterior cervical laminectomy multiple level cervical three - cervical seven with posterior cervical fusion with facet screws     Patient location during evaluation: PACU Anesthesia Type: General Level of consciousness: awake and alert Pain management: pain level controlled Vital Signs Assessment: post-procedure vital signs reviewed and stable Respiratory status: spontaneous breathing, nonlabored ventilation and respiratory function stable Cardiovascular status: stable and blood pressure returned to baseline Anesthetic complications: no   No notable events documented.  Last Vitals:  Vitals:   10/28/23 1700 10/28/23 1715  BP: (!) 155/82 (!) 157/72  Pulse: 82 82  Resp: 19 13  Temp:    SpO2: 98% 94%                  Debby FORBES Like

## 2023-10-28 NOTE — Progress Notes (Signed)
   10/28/23 1829  Vitals  Temp (!) 97.5 F (36.4 C)  Temp Source Oral  BP (!) 169/86  MAP (mmHg) 108  BP Location Right Arm  BP Method Automatic  Patient Position (if appropriate) Lying  Pulse Rate 97  Pulse Rate Source Monitor  ECG Heart Rate 98  Resp (!) 23  Level of Consciousness  Level of Consciousness Alert  MEWS COLOR  MEWS Score Color Green  Oxygen  Therapy  SpO2 92 %  O2 Device Room Air  MEWS Score  MEWS Temp 0  MEWS Systolic 0  MEWS Pulse 0  MEWS RR 1  MEWS LOC 0  MEWS Score 1   Pt admitted to the unit from PACU. Pt is Ax0x4 . PT VSS and  assessment completed. Pt is oriented to the unit.Pt pain level on arrival was 5/10. Pt wife is at the bedside. Call-bell within reach and bed in lowest position.

## 2023-10-28 NOTE — Op Note (Signed)
 Date of surgery: 10/28/2023 Preoperative diagnosis: Cervical stenosis with myelopathy C3-4 C4-5 C5-6 and C6-C7 ossification of posterior longitudinal ligament Postoperative diagnosis: Same Procedure: Cervical laminectomy C3 C4-C5-C6 and C7 with posterolateral facet screw fixation from C3-C7 posterolateral arthrodesis with local autograft C3-C7 Surgeon: Victory Gens Anesthesia: General Endotracheal Indications: Dale Green is a 68 year old individuals had significant difficulty with numbness and tingling in the arms in addition to significant neck and shoulder pain.  An MRI demonstrated presence of severe spondylitic stenosis at multiple levels starting from C3 down to C7.  He had flattening of the spinal cord with some cord signal change at the level of C4-C5.  He was advised regarding the need for surgical decompression posteriorly as it was noted that he had broad swaths of ossification of the posterior longitudinal ligament throughout his cervical canal.  Seizure: The patient was brought to the operating room supine on the stretcher.  After the smooth induction of general tracheal anesthesia attempts at placing a Foley catheter were unsuccessful and urology consultation was called.  Several attempts at scoping preoperatively yielded inability to visualize the passage to the urethra.  The patient then underwent the surgical procedure with the plan of further scoping with a different device after the procedure was completed.  Patient was placed in the 3 pin headrest and then turned prone onto the operating table.  After he was secured in a prone position and the bony prominences were carefully padded protected and the shoulders were taped inferiorly the back of the neck was then shaved scrubbed with alcohol and DuraPrep and draped in a sterile fashion.  A midline incision was made from the base of the neck inferiorly.  Dissection was carried down to the cervical spinous processes and the first spinous  process was identified positively as C3.  Then the dissection was carried down in C3 and the sublaminar spaces on either side using a subperiosteal technique.  The dissection was carried down to C7.  Self-retaining retractors were placed deep into the wound.  Once the lamina were isolated out to the area of the facets on the lateral aspects facet entry screws were then drilled through the external landmarks available at C3 C4-C5-C6 and C7.  Once the screws were set the bone around this area was decorticated slightly.  Next attention was turned to the laminectomy and using a 2 mm drill bit and a high-speed bit we drilled through the lamina and the lateral aspect on the left and on the right.  Laminar arches were then removed starting in the midportion at C4-C5 removing the inferior lamina of C6 and C7 and then superiorly working to remove the lamina of C3 after drilling bilaterally.  There is some significant epidural venous bleeding in the lateral gutters and this was controlled with bipolar cautery and some Surgifoam.  When adequate hemostasis was obtained the laminectomies were explored laterally to decompress the pads into the nerve root openings at C4-5 C5-6 and C6-C7 this was particularly stenotic on the right side.  When adequate decompression was obtained and hemostasis was established again we then placed facet screws at C3-C7 and these were 12 mm x 3.5 mm screws.  The procedure was completed bilaterally.  The bone was decorticated prior to placing the screws into each of the individual holes.  Once the screws were placed an 80 mm precontoured rod was fit between the C3 and C7 screws in a neutral construct.  These were then tightened and torqued in a neutral pattern.  Next  bone graft which was harvested from the laminectomy itself was cleaned and ground was placed as a rather thick and paste into the lateral gutters.  Once the bone graft was placed from C3 down to C7 final radiographs were obtained in AP and  lateral projection identifying good position of the hardware.  Laminectomy was carefully inspected and checked for hemostasis again the wound was then closed over a large Hemovac drain brought out through separate stab incision.  The cervical dorsal fascia was closed with #1 and 0 Vicryl and then 2-0 Vicryl was used in the subcutaneous tissues and 3-0 Vicryl and 4-0 Vicryl for the final subcuticular closure.  A dry sterile dressing was applied to the back of the neck.  Patient was then turned back into the supine position on his bed for further evaluation and placement of a Foley catheter.  Loss for the procedure was estimated 500 cc.

## 2023-10-28 NOTE — Transfer of Care (Signed)
 Immediate Anesthesia Transfer of Care Note  Patient: Dale Green  Procedure(s) Performed: Posterior cervical laminectomy multiple level cervical three - cervical seven with posterior cervical fusion with facet screws  Patient Location: PACU  Anesthesia Type:General  Level of Consciousness: awake, alert , and oriented  Airway & Oxygen  Therapy: Patient Spontanous Breathing and Patient connected to nasal cannula oxygen   Post-op Assessment: Report given to RN and Post -op Vital signs reviewed and stable  Post vital signs: Reviewed and stable  Last Vitals:  Vitals Value Taken Time  BP 140/70 10/28/23 15:47  Temp    Pulse 91 10/28/23 15:51  Resp 24 10/28/23 15:51  SpO2 99 % 10/28/23 15:51  Vitals shown include unfiled device data.  Last Pain:  Vitals:   10/28/23 0614  TempSrc:   PainSc: 0-No pain         Complications: No notable events documented.

## 2023-10-28 NOTE — Progress Notes (Signed)
 Orthopedic Tech Progress Note Patient Details:  Jb Dulworth [birth year] [id number]  Ortho Devices Type of Ortho Device: Soft collar Ortho Device/Splint Location: neck Ortho Device/Splint Interventions: Adjustment, Other (comment)   Post Interventions Patient Tolerated: Other (comment) Instructions Provided: Other (comment) Coworkers tried to apply soft collar but soft collar did not fit around patient's neck. Tried to apply XL soft collar with extenders but soft collar still did not fit on patient's neck. Camellia Bo 10/28/2023, 10:11 PM

## 2023-10-28 NOTE — Anesthesia Procedure Notes (Signed)
 Procedure Name: Intubation Date/Time: 10/28/2023 8:16 AM  Performed by: Tytiana Coles A, CRNAPre-anesthesia Checklist: Patient identified, Emergency Drugs available, Suction available and Patient being monitored Patient Re-evaluated:Patient Re-evaluated prior to induction Oxygen  Delivery Method: Circle System Utilized Preoxygenation: Pre-oxygenation with 100% oxygen  Induction Type: IV induction Ventilation: Mask ventilation without difficulty Laryngoscope Size: Glidescope and 4 Grade View: Grade I Tube type: Oral Tube size: 7.5 mm Number of attempts: 1 Airway Equipment and Method: Stylet and Oral airway Placement Confirmation: ETT inserted through vocal cords under direct vision, positive ETCO2 and breath sounds checked- equal and bilateral Secured at: 23 cm Tube secured with: Tape Dental Injury: Teeth and Oropharynx as per pre-operative assessment

## 2023-10-29 LAB — BASIC METABOLIC PANEL WITH GFR
Anion gap: 11 (ref 5–15)
BUN: 28 mg/dL — ABNORMAL HIGH (ref 8–23)
CO2: 25 mmol/L (ref 22–32)
Calcium: 8.3 mg/dL — ABNORMAL LOW (ref 8.9–10.3)
Chloride: 102 mmol/L (ref 98–111)
Creatinine, Ser: 2.16 mg/dL — ABNORMAL HIGH (ref 0.61–1.24)
GFR, Estimated: 32 mL/min — ABNORMAL LOW (ref >60)
Glucose, Bld: 150 mg/dL — ABNORMAL HIGH (ref 70–99)
Potassium: 4.9 mmol/L (ref 3.5–5.1)
Sodium: 138 mmol/L (ref 135–145)

## 2023-10-29 LAB — CBC
HCT: 35.1 % — ABNORMAL LOW (ref 39.0–52.0)
Hemoglobin: 11.2 g/dL — ABNORMAL LOW (ref 13.0–17.0)
MCH: 29 pg (ref 26.0–34.0)
MCHC: 31.9 g/dL (ref 30.0–36.0)
MCV: 90.9 fL (ref 80.0–100.0)
Platelets: 209 K/uL (ref 150–400)
RBC: 3.86 MIL/uL — ABNORMAL LOW (ref 4.22–5.81)
RDW: 14.1 % (ref 11.5–15.5)
WBC: 25.4 K/uL — ABNORMAL HIGH (ref 4.0–10.5)
nRBC: 0 % (ref 0.0–0.2)

## 2023-10-29 MED ORDER — METHOCARBAMOL 500 MG PO TABS: 500.0000 mg | ORAL_TABLET | Freq: Four times a day (QID) | ORAL | 2 refills | Status: AC | PRN

## 2023-10-29 MED ORDER — CHLORHEXIDINE GLUCONATE CLOTH 2 % EX PADS
6.0000 | MEDICATED_PAD | Freq: Every day | CUTANEOUS | Status: DC
  Administered 2023-10-29 – 2023-11-04 (×7): 6 via TOPICAL

## 2023-10-29 MED ORDER — OXYCODONE-ACETAMINOPHEN 5-325 MG PO TABS: 1.0000 | ORAL_TABLET | Freq: Four times a day (QID) | ORAL | 0 refills | Status: AC | PRN

## 2023-10-29 MED ORDER — CEPHALEXIN 500 MG PO CAPS: 500.0000 mg | ORAL_CAPSULE | Freq: Three times a day (TID) | ORAL | 0 refills | Status: AC

## 2023-10-29 MED FILL — Thrombin For Soln 5000 Unit: CUTANEOUS | Qty: 5000 | Status: AC

## 2023-10-29 NOTE — Evaluation (Signed)
 Occupational Therapy Evaluation Patient Details Name: Dale Green MRN: [id number] DOB: Aug 05, 1955 Today's Date: 10/29/2023   History of Present Illness   68 yo male s/p C3-7 laminectomy with posterolateral facet screw fixation from C3-7, posterior cervical fusion C3-7 10/9. PMH includes BPH, CKD, gout, HLD, HTN, OA.     Clinical Impressions Dale Green was evaluated s/p the above admission list. He is indep at baseline. Upon evaluation the pt was limited by surgical pain, cervical precautions, unsteady balance and decreased activity tolerance. Overall he needed min A for bed mobility and close CGA for initial transfer and gait with RW however he progressed to supervision A for hallway mobility. Due to the deficits listed below the pt also needs up to mod A for LB ADLs and set up A for UB ADLs in sitting. Pt will benefit from continued acute OT services to discharge home with the support of his family.       If plan is discharge home, recommend the following:   A little help with walking and/or transfers;A little help with bathing/dressing/bathroom;Assistance with cooking/housework;Assist for transportation     Functional Status Assessment   Patient has had a recent decline in their functional status and demonstrates the ability to make significant improvements in function in a reasonable and predictable amount of time.     Equipment Recommendations   Other (comment) (RW)     Recommendations for Other Services         Precautions/Restrictions   Precautions Precautions: Fall;Cervical Precaution Booklet Issued: No Recall of Precautions/Restrictions: Intact Precaution/Restrictions Comments: reviewed cervical precautions; soft collar does not fit pt and per neurosurgery pt okay to not wear it as long as he is following precautions Restrictions Weight Bearing Restrictions Per Provider Order: No     Mobility Bed Mobility Overal bed mobility: Needs Assistance Bed  Mobility: Rolling, Sidelying to Sit Rolling: Contact guard assist       Sit to sidelying: Min assist      Transfers Overall transfer level: Needs assistance Equipment used: Rolling walker (2 wheels) Transfers: Sit to/from Stand Sit to Stand: Contact guard assist           General transfer comment: CGA initially, once ambulating in the hall pt progressed to supervision A      Balance Overall balance assessment: Needs assistance Sitting-balance support: No upper extremity supported, Feet supported Sitting balance-Leahy Scale: Fair     Standing balance support: Bilateral upper extremity supported, During functional activity Standing balance-Leahy Scale: Poor                             ADL either performed or assessed with clinical judgement   ADL Overall ADL's : Needs assistance/impaired Eating/Feeding: Independent   Grooming: Supervision/safety;Standing   Upper Body Bathing: Set up;Sitting   Lower Body Bathing: Minimal assistance;Sit to/from stand   Upper Body Dressing : Set up;Sitting   Lower Body Dressing: Moderate assistance;Sit to/from stand   Toilet Transfer: Contact guard assist;Ambulation;Rolling walker (2 wheels) Toilet Transfer Details (indicate cue type and reason): foley cath Toileting- Clothing Manipulation and Hygiene: Supervision/safety       Functional mobility during ADLs: Contact guard assist General ADL Comments: limited by pain, spinal precautions and balance     Vision Baseline Vision/History: 0 No visual deficits Vision Assessment?: No apparent visual deficits     Perception Perception: Not tested       Praxis Praxis: Not tested       Pertinent  Vitals/Pain Pain Assessment Pain Assessment: Faces Faces Pain Scale: Hurts little more Pain Location: neck Pain Descriptors / Indicators: Sore, Operative site guarding Pain Intervention(s): Limited activity within patient's tolerance, Monitored during session      Extremity/Trunk Assessment Upper Extremity Assessment Upper Extremity Assessment: RUE deficits/detail RUE Deficits / Details: paraestheias throughout 1-3rd digits, pt stated no change from before surgery RUE Sensation: decreased light touch RUE Coordination: decreased fine motor   Lower Extremity Assessment Lower Extremity Assessment: Defer to PT evaluation   Cervical / Trunk Assessment Cervical / Trunk Assessment: Neck Surgery   Communication Communication Communication: No apparent difficulties   Cognition Arousal: Alert Behavior During Therapy: WFL for tasks assessed/performed Cognition: No apparent impairments                               Following commands: Intact       Cueing  General Comments   Cueing Techniques: Verbal cues  VSS on RA, pt noted new tongue numbess   Exercises     Shoulder Instructions      Home Living Family/patient expects to be discharged to:: Private residence Living Arrangements: Spouse/significant other Available Help at Discharge: Family Type of Home: House Home Access: Stairs to enter Secretary/administrator of Steps: 2 Entrance Stairs-Rails: None Home Layout: One level     Bathroom Shower/Tub: Walk-in Human resources officer: Standard     Home Equipment: Medical laboratory scientific officer - single point;Crutches          Prior Functioning/Environment Prior Level of Function : Independent/Modified Independent             Mobility Comments: indep, no AD      OT Problem List: Decreased strength;Decreased range of motion;Decreased activity tolerance;Impaired balance (sitting and/or standing);Decreased safety awareness;Decreased knowledge of use of DME or AE;Decreased knowledge of precautions   OT Treatment/Interventions: Self-care/ADL training;Therapeutic exercise;DME and/or AE instruction;Therapeutic activities;Patient/family education;Balance training      OT Goals(Current goals can be found in the care plan section)    Acute Rehab OT Goals Patient Stated Goal: less pain OT Goal Formulation: With patient Time For Goal Achievement: 11/12/23 Potential to Achieve Goals: Good ADL Goals Pt Will Perform Grooming: with modified independence;standing Pt Will Perform Lower Body Dressing: with modified independence;sit to/from stand Pt Will Transfer to Toilet: with modified independence;ambulating   OT Frequency:  Min 2X/week    Co-evaluation              AM-PAC OT 6 Clicks Daily Activity     Outcome Measure Help from another person eating meals?: None Help from another person taking care of personal grooming?: A Little Help from another person toileting, which includes using toliet, bedpan, or urinal?: A Little Help from another person bathing (including washing, rinsing, drying)?: A Little Help from another person to put on and taking off regular upper body clothing?: A Little Help from another person to put on and taking off regular lower body clothing?: A Lot 6 Click Score: 18   End of Session Equipment Utilized During Treatment: Rolling walker (2 wheels) Nurse Communication: Mobility status  Activity Tolerance: Patient tolerated treatment well Patient left: in chair;with call bell/phone within reach  OT Visit Diagnosis: Unsteadiness on feet (R26.81);Other abnormalities of gait and mobility (R26.89);Muscle weakness (generalized) (M62.81)                Time: 9079-9057 OT Time Calculation (min): 22 min Charges:  OT General Charges $OT Visit:  1 Visit OT Evaluation $OT Eval Moderate Complexity: 1 Mod  Lucie Kendall, OTR/L Acute Rehabilitation Services Office [phone] Secure Chat Communication Preferred   Lucie JONETTA Kendall 10/29/2023, 1:18 PM

## 2023-10-29 NOTE — Discharge Summary (Addendum)
 Physician Discharge Summary  Patient ID: Dale Green MRN: [id number] DOB/AGE: January 22, 1955 68 y.o.  Admit date: 10/28/2023 Discharge date: 10/30/2023  Admission Diagnoses: Cervical spondylosis with myelopathy C3-4 C4-5 C5-6 and C6-C7  Discharge Diagnoses: Cervical spondylosis with myelopathy C3-C7 Principal Problem:   Cervical myelopathy [facility])   Discharged Condition: good  Hospital Course: Patient underwent surgical decompression posteriorly from C3-C7.  He tolerated surgery well  Consults: None  Significant Diagnostic Studies: None  Treatments: surgery: See op note  Discharge Exam: Blood pressure (!) 122/54, pulse 100, temperature 97.8 F (36.6 C), temperature source Oral, resp. rate 18, height 5' 10 (1.778 m), weight 130.6 kg, SpO2 97%. Incision is clean and dry motor function is good in upper and lower extremities.  Disposition: Discharge disposition: 01-Home or Self Care       Discharge Instructions     Call MD for:  redness, tenderness, or signs of infection (pain, swelling, redness, odor or green/yellow discharge around incision site)   Complete by: As directed    Call MD for:  severe uncontrolled pain   Complete by: As directed    Call MD for:  temperature >100.4   Complete by: As directed    Diet - low sodium heart healthy   Complete by: As directed    Discharge wound care:   Complete by: As directed    Remove dressing before showering.  After shower pat dry paint incision with Betadine and cover with light gauze.  Do this for the next 6 days.  Thereafter can leave wound open if it is remaining dry   Incentive spirometry RT   Complete by: As directed    Increase activity slowly   Complete by: As directed       Allergies as of 10/29/2023   No Known Allergies      Medication List     TAKE these medications    allopurinol  300 MG tablet Commonly known as: ZYLOPRIM  Take 300 mg by mouth in the morning.   cephALEXin 500 MG capsule Commonly  known as: KEFLEX Take 1 capsule (500 mg total) by mouth 3 (three) times daily.   FLAXSEED OIL PO Take 1,000 mg by mouth in the morning.   methocarbamol 500 MG tablet Commonly known as: ROBAXIN Take 1 tablet (500 mg total) by mouth every 6 (six) hours as needed for muscle spasms.   MSM PO Take 1,000 mg by mouth in the morning.   oxyCODONE-acetaminophen  5-325 MG tablet Commonly known as: PERCOCET/ROXICET Take 1-2 tablets by mouth every 6 (six) hours as needed for moderate pain (pain score 4-6) or severe pain (pain score 7-10).   penicillin v potassium 500 MG tablet Commonly known as: VEETID Take 500 mg by mouth 4 (four) times daily.   rosuvastatin 20 MG tablet Commonly known as: CRESTOR Take 20 mg by mouth in the morning.   tamsulosin 0.4 MG Caps capsule Commonly known as: FLOMAX Take 0.4 mg by mouth in the morning.   VITAMIN D3 PO Take 5,000 Units by mouth in the morning.               Discharge Care Instructions  (From admission, onward)           Start     Ordered   10/29/23 0000  Discharge wound care:       Comments: Remove dressing before showering.  After shower pat dry paint incision with Betadine and cover with light gauze.  Do this for the next 6 days.  Thereafter  can leave wound open if it is remaining dry   10/29/23 1835             Signed: Victory JINNY Gens 10/29/2023, 6:35 PM

## 2023-10-29 NOTE — Evaluation (Signed)
 Physical Therapy Evaluation Patient Details Name: Dale Green MRN: [id number] DOB: 1955/03/30 Today's Date: 10/29/2023  History of Present Illness  68 yo male s/p C3-7 laminectomy with posterolateral facet screw fixation from C3-7, posterior cervical fusion C3-7 10/9. PMH includes BPH, CKD, gout, HLD, HTN, OA.  Clinical Impression  Pt presents with neck pain, impaired balance, antalgic gait, and decreased activity tolerance. Pt to benefit from acute PT to address deficits. Pt ambulated good hallway distance with use of RW, pt limited by neck pain and stiffness. PT reviewed cervical precautions functionally, pt maintains well. PT to progress mobility as tolerated, and will continue to follow acutely.          If plan is discharge home, recommend the following: A little help with walking and/or transfers;A little help with bathing/dressing/bathroom   Can travel by private vehicle        Equipment Recommendations None recommended by PT  Recommendations for Other Services       Functional Status Assessment Patient has had a recent decline in their functional status and demonstrates the ability to make significant improvements in function in a reasonable and predictable amount of time.     Precautions / Restrictions Precautions Precautions: Fall;Cervical Precaution/Restrictions Comments: reviewed cervical precautions; soft collar does not fit pt and per neurosurgery pt okay to not wear it as long as he is following precautions Restrictions Weight Bearing Restrictions Per Provider Order: No      Mobility  Bed Mobility Overal bed mobility: Needs Assistance Bed Mobility: Rolling, Sit to Sidelying Rolling: Min assist       Sit to sidelying: Min assist General bed mobility comments: assist for completion of LE elevation into bed, roll onto back following precautions    Transfers Overall transfer level: Needs assistance Equipment used: Rolling walker (2 wheels) Transfers:  Sit to/from Stand Sit to Stand: Contact guard assist           General transfer comment: for safety, slow to rise and sit.    Ambulation/Gait Ambulation/Gait assistance: Contact guard assist Gait Distance (Feet): 150 Feet Assistive device: Rolling walker (2 wheels) Gait Pattern/deviations: Step-through pattern, Decreased stride length, Trunk flexed Gait velocity: decr     General Gait Details: close guard for safety, cues for upright posture and placement in RW  Stairs            Wheelchair Mobility     Tilt Bed    Modified Rankin (Stroke Patients Only)       Balance Overall balance assessment: Needs assistance Sitting-balance support: No upper extremity supported, Feet supported Sitting balance-Leahy Scale: Fair     Standing balance support: Bilateral upper extremity supported, During functional activity Standing balance-Leahy Scale: Poor Standing balance comment: reliant on external support post-op                             Pertinent Vitals/Pain Pain Assessment Pain Assessment: 0-10 Pain Score: 2  Pain Location: neck Pain Descriptors / Indicators: Sore, Operative site guarding Pain Intervention(s): Limited activity within patient's tolerance, Monitored during session, Repositioned    Home Living Family/patient expects to be discharged to:: Private residence Living Arrangements: Spouse/significant other Available Help at Discharge: Family Type of Home: House Home Access: Stairs to enter Entrance Stairs-Rails: None Entrance Stairs-Number of Steps: 2   Home Layout: One level Home Equipment: Agricultural consultant (2 wheels)      Prior Function Prior Level of Function : Independent/Modified Independent  Extremity/Trunk Assessment   Upper Extremity Assessment Upper Extremity Assessment: Defer to OT evaluation    Lower Extremity Assessment Lower Extremity Assessment: Overall WFL for tasks assessed     Cervical / Trunk Assessment Cervical / Trunk Assessment: Neck Surgery  Communication   Communication Communication: No apparent difficulties    Cognition Arousal: Alert Behavior During Therapy: WFL for tasks assessed/performed   PT - Cognitive impairments: No apparent impairments                         Following commands: Intact       Cueing Cueing Techniques: Verbal cues, Gestural cues     General Comments General comments (skin integrity, edema, etc.): posterior neck incision covered with dressing, hemovac. Pt complaining of tip of tongue numbness    Exercises     Assessment/Plan    PT Assessment Patient needs continued PT services  PT Problem List Decreased strength;Decreased mobility;Decreased activity tolerance;Decreased balance;Decreased knowledge of use of DME;Pain;Decreased safety awareness;Obesity;Decreased knowledge of precautions       PT Treatment Interventions DME instruction;Therapeutic activities;Gait training;Therapeutic exercise;Patient/family education;Balance training;Stair training;Functional mobility training;Neuromuscular re-education    PT Goals (Current goals can be found in the Care Plan section)  Acute Rehab PT Goals PT Goal Formulation: With patient Time For Goal Achievement: 11/12/23 Potential to Achieve Goals: Good    Frequency Min 5X/week     Co-evaluation               AM-PAC PT 6 Clicks Mobility  Outcome Measure Help needed turning from your back to your side while in a flat bed without using bedrails?: A Little Help needed moving from lying on your back to sitting on the side of a flat bed without using bedrails?: A Little Help needed moving to and from a bed to a chair (including a wheelchair)?: A Little Help needed standing up from a chair using your arms (e.g., wheelchair or bedside chair)?: A Little Help needed to walk in hospital room?: A Little Help needed climbing 3-5 steps with a railing? : A  Little 6 Click Score: 18    End of Session   Activity Tolerance: Patient tolerated treatment well Patient left: with call bell/phone within reach;in bed;with bed alarm set;with family/visitor present;with nursing/sitter in room Nurse Communication: Mobility status PT Visit Diagnosis: Other abnormalities of gait and mobility (R26.89);Muscle weakness (generalized) (M62.81)    Time: 1131-1150 PT Time Calculation (min) (ACUTE ONLY): 19 min   Charges:   PT Evaluation $PT Eval Low Complexity: 1 Low   PT General Charges $$ ACUTE PT VISIT: 1 Visit         Johana RAMAN, PT DPT Acute Rehabilitation Services Secure Chat Preferred  Office [phone]   Victoriano Campion E Stroup 10/29/2023, 12:10 PM

## 2023-10-30 MED ORDER — BACITRACIN ZINC 500 UNIT/GM EX OINT
TOPICAL_OINTMENT | Freq: Three times a day (TID) | CUTANEOUS | Status: DC
  Administered 2023-10-30: 1 via TOPICAL
  Administered 2023-10-30 – 2023-11-05 (×13): 31.5 via TOPICAL
  Filled 2023-10-30: qty 28.4

## 2023-10-30 NOTE — Progress Notes (Signed)
 Assessment 68 y/o M who is s/p C3-7 PCDF (10/9)  LOS: 2 days    Plan: AAT PTOT Reevaluate tomorrow morning for possible discharge depending on therapy and pain   Subjective: Plan was to discharge yesterday afternoon, but patient had worsening of his pain. He also saw PT who felt he needed a few more sessions before safely discharging home  Objective: Vital signs in last 24 hours: Temp:  [97.8 F (36.6 C)-98.9 F (37.2 C)] 98.1 F (36.7 C) (10/11 0746) Pulse Rate:  [85-108] 85 (10/11 0746) Resp:  [17-20] 18 (10/11 0746) BP: (103-163)/(54-75) 163/75 (10/11 0746) SpO2:  [93 %-98 %] 98 % (10/11 0746)  Intake/Output from previous day: 10/10 0701 - 10/11 0700 In: 5 [I.V.:5] Out: 750 [Urine:650; Drains:100] Intake/Output this shift: No intake/output data recorded.  Exam: Awake, alert BUE and BLE full strength Foley in place  Lab Results: Recent Labs    10/29/23 0509  WBC 25.4*  HGB 11.2*  HCT 35.1*  PLT 209   BMET Recent Labs    10/29/23 0509  NA 138  K 4.9  CL 102  CO2 25  GLUCOSE 150*  BUN 28*  CREATININE 2.16*  CALCIUM 8.3*    Studies/Results: DG Cervical Spine 2 or 3 views Result Date: 10/28/2023 CLINICAL DATA:  Elective surgery. EXAM: CERVICAL SPINE - 2-3 VIEW COMPARISON:  None Available. FINDINGS: Two fluoroscopic spot views of the cervical spine submitted from the operating room. Posterior rod and pedicle screw fixation at 5 contiguous levels, likely C3 through C7, levels difficult to delineate due to soft tissue attenuation. Fluoroscopy time 9 seconds. Dose 10.53 mGy. IMPRESSION: Intraoperative fluoroscopy during cervical spine surgery. Electronically Signed   By: Andrea Gasman M.D.   On: 10/28/2023 13:48   DG Cervical Spine 1 View Result Date: 10/28/2023 CLINICAL DATA:  Elective surgery. EXAM: DG CERVICAL SPINE - 1 VIEW COMPARISON:  None Available. FINDINGS: Single cross-table lateral view of the cervical spine submitted from the operating room.  Surgical instruments posteriorly project at the C3 level. Multiple overlying artifacts. IMPRESSION: Intraoperative localization during cervical spine surgery. Electronically Signed   By: Andrea Gasman M.D.   On: 10/28/2023 13:48   DG C-Arm 1-60 Min-No Report Result Date: 10/28/2023 Fluoroscopy was utilized by the requesting physician.  No radiographic interpretation.      Dorn JONELLE Glade 10/30/2023, 9:58 AM

## 2023-10-30 NOTE — Progress Notes (Signed)
 Occupational Therapy Treatment Patient Details Name: Dale Green MRN: [id number] DOB: 11/02/1955 Today's Date: 10/30/2023   History of present illness 68 yo male s/p C3-7 laminectomy with posterolateral facet screw fixation from C3-7, posterior cervical fusion C3-7 10/9. PMH includes BPH, CKD, gout, HLD, HTN, OA (per pt has needed L TKA x 10 yrs).   OT comments  Pt. Seen for skilled OT treatment session with spouse present.  Both confirm she will assist with LB ADLs.  Reviewed energy conservation strategies for them both during ADLs for increased safety and decreased burden of care.  Short distance ambulation to b.room for toileting.  Pt. With heavy reliance of BUE use even with cues for cervical precautions secondary to issues with BLEs/knees.  Pt. And spouse confirm ramp installation occurring today for safe entry to/from house. (See PT notes for further details from this am).  Cont. With acute OT POC.        If plan is discharge home, recommend the following:  A little help with walking and/or transfers;A little help with bathing/dressing/bathroom;Assistance with cooking/housework;Assist for transportation   Equipment Recommendations  Other (comment)    Recommendations for Other Services      Precautions / Restrictions Precautions Precautions: Fall;Cervical Precaution/Restrictions Comments: pt able to verbalize precautions       Mobility Bed Mobility               General bed mobility comments: in recliner at beg. of session, and in b.room on toilet at end of session    Transfers Overall transfer level: Needs assistance Equipment used: Rolling walker (2 wheels) Transfers: Sit to/from Stand, Bed to chair/wheelchair/BSC Sit to Stand: Contact guard assist     Step pivot transfers: Contact guard assist     General transfer comment: cues for limited BUE use for cervical precautions but pt. unable to sit/stand stand/sit without BUE use secondary to limitations of use  of BLEs (knees, L greater than R)     Balance                                           ADL either performed or assessed with clinical judgement   ADL Overall ADL's : Needs assistance/impaired   Eating/Feeding Details (indicate cue type and reason): pt. and spouse report he has been utilizing hand held foods as he can not see what is on utensil to get proper aim/completion to mouth.  reviewed adj. height of plate for better visual cues.  pts. spouse states she has been doing intermittent assistance with feeding for items that are not hand held             Upper Body Dressing : Contact guard assist;Standing Upper Body Dressing Details (indicate cue type and reason): pts. spouse assisted with gown removal in standing prior to toilet use Lower Body Dressing: Sitting/lateral leans;Cueing for compensatory techniques Lower Body Dressing Details (indicate cue type and reason): pt. and spouse report she will assist with LB ADLs reviewed energy conservation strategies for them both with donning (pre loading garments) and avoiding multiple sit/stands for them both Toilet Transfer: Contact guard assist;Ambulation;Rolling walker (2 wheels);Regular Toilet;Grab bars Toilet Transfer Details (indicate cue type and reason): cues for managment of rw up small incline-attempts at pt not using BUEs so heavily during transfers but he has to secondary to limited strength and use of BLEs  Functional mobility during ADLs: Contact guard assist General ADL Comments: cues for limited use of BUEs for maintiang cervical precautions but pt. cont. with heavy reliance secondary to issues with BLEs (knees, L greater than R).  they confirm ramp installation today, they confirm spouse will assist with LB ADLs at home.    Extremity/Trunk Assessment              Vision       Restaurant manager, fast food Communication: No apparent difficulties   Cognition  Arousal: Alert Behavior During Therapy: WFL for tasks assessed/performed                                 Following commands: Intact        Cueing   Cueing Techniques: Verbal cues  Exercises      Shoulder Instructions       General Comments Wife present and reports multiple neck issues/surgeries herself. Reports she cannot provide the level of assist that PT provided (min assist) when ascending steps.    Pertinent Vitals/ Pain       Pain Assessment Pain Assessment: No/denies pain  Home Living                                          Prior Functioning/Environment              Frequency  Min 2X/week        Progress Toward Goals  OT Goals(current goals can now be found in the care plan section)  Progress towards OT goals: Progressing toward goals     Plan      Co-evaluation                 AM-PAC OT 6 Clicks Daily Activity     Outcome Measure   Help from another person eating meals?: None Help from another person taking care of personal grooming?: A Little Help from another person toileting, which includes using toliet, bedpan, or urinal?: A Little Help from another person bathing (including washing, rinsing, drying)?: A Little Help from another person to put on and taking off regular upper body clothing?: A Little Help from another person to put on and taking off regular lower body clothing?: A Lot 6 Click Score: 18    End of Session Equipment Utilized During Treatment: Rolling walker (2 wheels);Gait belt  OT Visit Diagnosis: Unsteadiness on feet (R26.81);Other abnormalities of gait and mobility (R26.89);Muscle weakness (generalized) (M62.81)   Activity Tolerance Patient tolerated treatment well   Patient Left Other (comment) (left in b.room with instructions for call bell when finished, met with CNA and updated her also of his location and to be on look out for him calling.  reviwed mobility with her and to be  on L side as that is the weaker reported knee)   Nurse Communication Mobility status;Other (comment) (alerted cna of pts. location and how to assist him back to bed when he was finished in the b.room)        Time: 8981-8964 OT Time Calculation (min): 17 min  Charges: OT General Charges $OT Visit: 1 Visit OT Treatments $Self Care/Home Management : 8-22 mins  Randall, COTA/L Acute Rehabilitation [phone]   Dale Green  10/30/2023, 11:35 AM

## 2023-10-30 NOTE — Progress Notes (Signed)
 Assessment 68 y/o M who is s/p C3-7 PCDF (10/9)  LOS: 3 days    Plan: AAT, PTOT Awaiting better pain control prior to discharge   Subjective: Patient still c/o significant pain and states he is not ready to go home. Has not had a BM but is passing gas. Working with therapy  Objective: Vital signs in last 24 hours: Temp:  [98.9 F (37.2 C)-100.7 F (38.2 C)] 99.2 F (37.3 C) (10/12 0800) Pulse Rate:  [88-104] 99 (10/12 0835) Resp:  [16-20] 16 (10/12 0835) BP: (132-158)/(62-79) 148/62 (10/12 0835) SpO2:  [92 %-95 %] 95 % (10/12 0835)  Intake/Output from previous day: 10/11 0701 - 10/12 0700 In: -  Out: 2325 [Urine:2325] Intake/Output this shift: No intake/output data recorded.  Exam: Awake, alert BUE and BLE full strength Foley in place  Lab Results: Recent Labs    10/29/23 0509  WBC 25.4*  HGB 11.2*  HCT 35.1*  PLT 209   BMET Recent Labs    10/29/23 0509  NA 138  K 4.9  CL 102  CO2 25  GLUCOSE 150*  BUN 28*  CREATININE 2.16*  CALCIUM 8.3*    Studies/Results: No results found.     Dorn JONELLE Glade 10/31/2023, 1:34 PM

## 2023-10-30 NOTE — Progress Notes (Signed)
 Patient requested communication with [facility] team since insertion of foley and urethral dilation on 10/9. [place] MD paged. This RN received callback. All of patient questions regarding urethral catheter answered by MD. Patient wife at bedside also verbalized understanding with no further questions. This RN received verbal order for bacitracin ointment 3 times a day for irritation/pain caused by urethral catheter.

## 2023-10-30 NOTE — Progress Notes (Signed)
 Physical Therapy Treatment Patient Details Name: Dale Green MRN: [id number] DOB: 1955-10-26 Today's Date: 10/30/2023   History of Present Illness 68 yo male s/p C3-7 laminectomy with posterolateral facet screw fixation from C3-7, posterior cervical fusion C3-7 10/9. PMH includes BPH, CKD, gout, HLD, HTN, OA (per pt has needed L TKA x 10 yrs).    PT Comments  Patient reports having a painful night. Received 2 pain medicines this morning and reports feeling dizzy upon standing. Progressed to ambulation with RW x 150 ft with CGA for safety. Cuing for less pressure on UEs and upright posture to decr pain in neck. Initiated stair training with wife present. Patient DOES NOT HAVE A RAIL AT HOME, however for safety used rail this morning. Pt required min assist with partial Lt knee buckling x 2. Wife reported she is unable to provide the level of assist (min assist) that PT provided due to her own limitations. (And, in fairness, he would need more like mod assist if he was not holding the rail). Upon discussion, he states he has a metal ramp that can be installed over his 2 steps to enter. He reports he can find someone to install it within 24 hours. The ramp would be a much safer option for entering the home. Burnard, RN made aware of concerns re: discharge home today due to poor performance on steps and need to have ramp installed.    If plan is discharge home, recommend the following: A little help with walking and/or transfers;A little help with bathing/dressing/bathroom;Assist for transportation;Help with stairs or ramp for entrance   Can travel by private vehicle        Equipment Recommendations  Rolling walker (2 wheels)    Recommendations for Other Services       Precautions / Restrictions Precautions Precautions: Fall;Cervical Precaution Booklet Issued: No Recall of Precautions/Restrictions: Intact Precaution/Restrictions Comments: pt able to verbalize  precautions Restrictions Weight Bearing Restrictions Per Provider Order: No     Mobility  Bed Mobility Overal bed mobility: Needs Assistance Bed Mobility: Rolling, Sidelying to Sit Rolling: Min assist, Used rails Sidelying to sit: Min assist, HOB elevated       General bed mobility comments: vc for technique; pt reports he will be sleeping in his electrically controlled recliner at home therefore allowed use of rail and HOB 25 degrees    Transfers Overall transfer level: Needs assistance Equipment used: Rolling walker (2 wheels) Transfers: Sit to/from Stand Sit to Stand: Contact guard assist           General transfer comment: pt reporting dizziness from meds; close guarding for safety and cues for proper hand placement    Ambulation/Gait Ambulation/Gait assistance: Contact guard assist Gait Distance (Feet): 150 Feet Assistive device: Rolling walker (2 wheels) Gait Pattern/deviations: Step-through pattern, Decreased stride length, Trunk flexed Gait velocity: decr     General Gait Details: close guard for safety, cues for upright posture, less pressure through UEs to reduce neck pain, and placement in RW   Stairs Stairs: Yes Stairs assistance: Min assist Stair Management: One rail Right, Step to pattern, Sideways, Forwards Number of Stairs: 2 General stair comments: for safety, use of rail although pt does not have at home; left knee with partial buckle x 2 as ascending (despite cues that he should lead with his strong leg); he felt he had to go down sideways with both hands on rail with slightly uncontrolled descent on LLE to RLE (due to rail on his left when descending)  Wheelchair Mobility     Tilt Bed    Modified Rankin (Stroke Patients Only)       Balance Overall balance assessment: Needs assistance Sitting-balance support: No upper extremity supported, Feet supported Sitting balance-Leahy Scale: Fair     Standing balance support: Bilateral upper  extremity supported, During functional activity Standing balance-Leahy Scale: Poor Standing balance comment: reliant on external support post-op                            Communication Communication Communication: No apparent difficulties  Cognition Arousal: Alert Behavior During Therapy: WFL for tasks assessed/performed   PT - Cognitive impairments: No apparent impairments                         Following commands: Intact      Cueing Cueing Techniques: Verbal cues  Exercises      General Comments General comments (skin integrity, edema, etc.): Wife present and reports multiple neck issues/surgeries herself. Reports she cannot provide the level of assist that PT provided (min assist) when ascending steps.      Pertinent Vitals/Pain Pain Assessment Pain Assessment: 0-10 Pain Score: 4  Pain Location: neck Pain Descriptors / Indicators: Sore, Operative site guarding Pain Intervention(s): Limited activity within patient's tolerance, Monitored during session, Premedicated before session    Home Living                          Prior Function            PT Goals (current goals can now be found in the care plan section) Acute Rehab PT Goals Patient Stated Goal: have time to get his metal ramp installed at home for safer entry into home PT Goal Formulation: With patient/family Time For Goal Achievement: 11/13/23 Potential to Achieve Goals: Good Progress towards PT goals:  (goals set)    Frequency    Min 5X/week      PT Plan      Co-evaluation              AM-PAC PT 6 Clicks Mobility   Outcome Measure  Help needed turning from your back to your side while in a flat bed without using bedrails?: A Little Help needed moving from lying on your back to sitting on the side of a flat bed without using bedrails?: A Little Help needed moving to and from a bed to a chair (including a wheelchair)?: A Little Help needed standing up  from a chair using your arms (e.g., wheelchair or bedside chair)?: A Little Help needed to walk in hospital room?: A Little Help needed climbing 3-5 steps with a railing? : A Little 6 Click Score: 18    End of Session Equipment Utilized During Treatment: Gait belt Activity Tolerance: Patient tolerated treatment well Patient left: with call bell/phone within reach;with family/visitor present;in chair;with nursing/sitter in room Nurse Communication: Mobility status;Other (comment) (pt to have ramp installed; not ready for discharge) PT Visit Diagnosis: Other abnormalities of gait and mobility (R26.89);Muscle weakness (generalized) (M62.81)     Time: 9155-9089 PT Time Calculation (min) (ACUTE ONLY): 26 min  Charges:    $Gait Training: 23-37 mins PT General Charges $$ ACUTE PT VISIT: 1 Visit                      Macario RAMAN, PT Acute Rehabilitation Services  Office [phone]  Macario SQUIBB Cherrelle Plante 10/30/2023, 9:22 AM

## 2023-10-30 NOTE — Plan of Care (Signed)
 Problem: Education: Goal: Knowledge of General Education information will improve Description: Including pain rating scale, medication(s)/side effects and non-pharmacologic comfort measures 10/30/2023 0006 by Marvis Kenneth SAILOR, RN Outcome: Progressing 10/29/2023 2236 by Marvis Kenneth SAILOR, RN Outcome: Progressing   Problem: Health Behavior/Discharge Planning: Goal: Ability to manage health-related needs will improve 10/30/2023 0006 by Marvis Kenneth SAILOR, RN Outcome: Progressing 10/29/2023 2236 by Marvis Kenneth SAILOR, RN Outcome: Progressing   Problem: Clinical Measurements: Goal: Ability to maintain clinical measurements within normal limits will improve 10/30/2023 0006 by Marvis Kenneth SAILOR, RN Outcome: Progressing 10/29/2023 2236 by Marvis Kenneth SAILOR, RN Outcome: Progressing Goal: Will remain free from infection 10/30/2023 0006 by Marvis Kenneth SAILOR, RN Outcome: Progressing 10/29/2023 2236 by Marvis Kenneth SAILOR, RN Outcome: Progressing Goal: Diagnostic test results will improve 10/30/2023 0006 by Marvis Kenneth SAILOR, RN Outcome: Progressing 10/29/2023 2236 by Marvis Kenneth SAILOR, RN Outcome: Progressing Goal: Respiratory complications will improve 10/30/2023 0006 by Marvis Kenneth SAILOR, RN Outcome: Progressing 10/29/2023 2236 by Marvis Kenneth SAILOR, RN Outcome: Progressing Goal: Cardiovascular complication will be avoided 10/30/2023 0006 by Marvis Kenneth SAILOR, RN Outcome: Progressing 10/29/2023 2236 by Marvis Kenneth SAILOR, RN Outcome: Progressing   Problem: Activity: Goal: Risk for activity intolerance will decrease 10/30/2023 0006 by Marvis Kenneth SAILOR, RN Outcome: Progressing 10/29/2023 2236 by Marvis Kenneth SAILOR, RN Outcome: Progressing   Problem: Nutrition: Goal: Adequate nutrition will be maintained 10/30/2023 0006 by Marvis Kenneth SAILOR, RN Outcome: Progressing 10/29/2023 2236 by Marvis Kenneth SAILOR, RN Outcome: Progressing   Problem: Coping: Goal: Level  of anxiety will decrease 10/30/2023 0006 by Marvis Kenneth SAILOR, RN Outcome: Progressing 10/29/2023 2236 by Marvis Kenneth SAILOR, RN Outcome: Progressing   Problem: Elimination: Goal: Will not experience complications related to bowel motility 10/30/2023 0006 by Marvis Kenneth SAILOR, RN Outcome: Progressing 10/29/2023 2236 by Marvis Kenneth SAILOR, RN Outcome: Progressing Goal: Will not experience complications related to urinary retention 10/30/2023 0006 by Marvis Kenneth SAILOR, RN Outcome: Progressing 10/29/2023 2236 by Marvis Kenneth SAILOR, RN Outcome: Progressing   Problem: Pain Managment: Goal: General experience of comfort will improve and/or be controlled 10/30/2023 0006 by Marvis Kenneth SAILOR, RN Outcome: Progressing 10/29/2023 2236 by Marvis Kenneth SAILOR, RN Outcome: Progressing   Problem: Safety: Goal: Ability to remain free from injury will improve 10/30/2023 0006 by Marvis Kenneth SAILOR, RN Outcome: Progressing 10/29/2023 2236 by Marvis Kenneth SAILOR, RN Outcome: Progressing   Problem: Skin Integrity: Goal: Risk for impaired skin integrity will decrease 10/30/2023 0006 by Marvis Kenneth SAILOR, RN Outcome: Progressing 10/29/2023 2236 by Marvis Kenneth SAILOR, RN Outcome: Progressing   Problem: Education: Goal: Ability to verbalize activity precautions or restrictions will improve 10/30/2023 0006 by Marvis Kenneth SAILOR, RN Outcome: Progressing 10/29/2023 2236 by Marvis Kenneth SAILOR, RN Outcome: Progressing Goal: Knowledge of the prescribed therapeutic regimen will improve 10/30/2023 0006 by Marvis Kenneth SAILOR, RN Outcome: Progressing 10/29/2023 2236 by Marvis Kenneth SAILOR, RN Outcome: Progressing Goal: Understanding of discharge needs will improve 10/30/2023 0006 by Marvis Kenneth SAILOR, RN Outcome: Progressing 10/29/2023 2236 by Marvis Kenneth SAILOR, RN Outcome: Progressing   Problem: Activity: Goal: Ability to avoid complications of mobility impairment will improve 10/30/2023  0006 by Marvis Kenneth SAILOR, RN Outcome: Progressing 10/29/2023 2236 by Marvis Kenneth SAILOR, RN Outcome: Progressing Goal: Ability to tolerate increased activity will improve 10/30/2023 0006 by Marvis Kenneth SAILOR, RN Outcome: Progressing 10/29/2023 2236 by Marvis Kenneth SAILOR, RN Outcome: Progressing Goal: Will remain free from falls 10/30/2023 0006 by Marvis Kenneth SAILOR, RN Outcome: Progressing 10/29/2023 2236 by  Marvis Kenneth SAILOR, RN Outcome: Progressing   Problem: Bowel/Gastric: Goal: Gastrointestinal status for postoperative course will improve 10/30/2023 0006 by Marvis Kenneth SAILOR, RN Outcome: Progressing 10/29/2023 2236 by Marvis Kenneth SAILOR, RN Outcome: Progressing   Problem: Clinical Measurements: Goal: Ability to maintain clinical measurements within normal limits will improve 10/30/2023 0006 by Marvis Kenneth SAILOR, RN Outcome: Progressing 10/29/2023 2236 by Marvis Kenneth SAILOR, RN Outcome: Progressing Goal: Postoperative complications will be avoided or minimized 10/30/2023 0006 by Marvis Kenneth SAILOR, RN Outcome: Progressing 10/29/2023 2236 by Marvis Kenneth SAILOR, RN Outcome: Progressing Goal: Diagnostic test results will improve 10/30/2023 0006 by Marvis Kenneth SAILOR, RN Outcome: Progressing 10/29/2023 2236 by Marvis Kenneth SAILOR, RN Outcome: Progressing   Problem: Pain Management: Goal: Pain level will decrease 10/30/2023 0006 by Marvis Kenneth SAILOR, RN Outcome: Progressing 10/29/2023 2236 by Marvis Kenneth SAILOR, RN Outcome: Progressing   Problem: Skin Integrity: Goal: Will show signs of wound healing 10/30/2023 0006 by Marvis Kenneth SAILOR, RN Outcome: Progressing 10/29/2023 2236 by Marvis Kenneth SAILOR, RN Outcome: Progressing   Problem: Health Behavior/Discharge Planning: Goal: Identification of resources available to assist in meeting health care needs will improve 10/30/2023 0006 by Marvis Kenneth SAILOR, RN Outcome: Progressing 10/29/2023 2236 by Marvis Kenneth SAILOR, RN Outcome: Progressing   Problem: Bladder/Genitourinary: Goal: Urinary functional status for postoperative course will improve 10/30/2023 0006 by Marvis Kenneth SAILOR, RN Outcome: Progressing 10/29/2023 2236 by Marvis Kenneth SAILOR, RN Outcome: Progressing

## 2023-10-31 MED ORDER — BISACODYL 5 MG PO TBEC
5.0000 mg | DELAYED_RELEASE_TABLET | Freq: Every day | ORAL | Status: DC | PRN
  Administered 2023-10-31: 10 mg via ORAL
  Filled 2023-10-31: qty 2

## 2023-10-31 NOTE — Plan of Care (Signed)
 Problem: Education: Goal: Knowledge of General Education information will improve Description: Including pain rating scale, medication(s)/side effects and non-pharmacologic comfort measures 10/31/2023 0028 by Marvis Kenneth SAILOR, RN Outcome: Progressing 10/30/2023 2054 by Marvis Kenneth SAILOR, RN Outcome: Progressing   Problem: Health Behavior/Discharge Planning: Goal: Ability to manage health-related needs will improve 10/31/2023 0028 by Marvis Kenneth SAILOR, RN Outcome: Progressing 10/30/2023 2054 by Marvis Kenneth SAILOR, RN Outcome: Progressing   Problem: Clinical Measurements: Goal: Ability to maintain clinical measurements within normal limits will improve 10/31/2023 0028 by Marvis Kenneth SAILOR, RN Outcome: Progressing 10/30/2023 2054 by Marvis Kenneth SAILOR, RN Outcome: Progressing Goal: Will remain free from infection 10/31/2023 0028 by Marvis Kenneth SAILOR, RN Outcome: Progressing 10/30/2023 2054 by Marvis Kenneth SAILOR, RN Outcome: Progressing Goal: Diagnostic test results will improve 10/31/2023 0028 by Marvis Kenneth SAILOR, RN Outcome: Progressing 10/30/2023 2054 by Marvis Kenneth SAILOR, RN Outcome: Progressing Goal: Respiratory complications will improve 10/31/2023 0028 by Marvis Kenneth SAILOR, RN Outcome: Progressing 10/30/2023 2054 by Marvis Kenneth SAILOR, RN Outcome: Progressing Goal: Cardiovascular complication will be avoided 10/31/2023 0028 by Marvis Kenneth SAILOR, RN Outcome: Progressing 10/30/2023 2054 by Marvis Kenneth SAILOR, RN Outcome: Progressing   Problem: Activity: Goal: Risk for activity intolerance will decrease 10/31/2023 0028 by Marvis Kenneth SAILOR, RN Outcome: Progressing 10/30/2023 2054 by Marvis Kenneth SAILOR, RN Outcome: Progressing   Problem: Nutrition: Goal: Adequate nutrition will be maintained 10/31/2023 0028 by Marvis Kenneth SAILOR, RN Outcome: Progressing 10/30/2023 2054 by Marvis Kenneth SAILOR, RN Outcome: Progressing   Problem: Coping: Goal: Level  of anxiety will decrease 10/31/2023 0028 by Marvis Kenneth SAILOR, RN Outcome: Progressing 10/30/2023 2054 by Marvis Kenneth SAILOR, RN Outcome: Progressing   Problem: Elimination: Goal: Will not experience complications related to bowel motility 10/31/2023 0028 by Marvis Kenneth SAILOR, RN Outcome: Progressing 10/30/2023 2054 by Marvis Kenneth SAILOR, RN Outcome: Progressing Goal: Will not experience complications related to urinary retention 10/31/2023 0028 by Marvis Kenneth SAILOR, RN Outcome: Progressing 10/30/2023 2054 by Marvis Kenneth SAILOR, RN Outcome: Progressing   Problem: Pain Managment: Goal: General experience of comfort will improve and/or be controlled 10/31/2023 0028 by Marvis Kenneth SAILOR, RN Outcome: Progressing 10/30/2023 2054 by Marvis Kenneth SAILOR, RN Outcome: Progressing   Problem: Safety: Goal: Ability to remain free from injury will improve 10/31/2023 0028 by Marvis Kenneth SAILOR, RN Outcome: Progressing 10/30/2023 2054 by Marvis Kenneth SAILOR, RN Outcome: Progressing   Problem: Skin Integrity: Goal: Risk for impaired skin integrity will decrease 10/31/2023 0028 by Marvis Kenneth SAILOR, RN Outcome: Progressing 10/30/2023 2054 by Marvis Kenneth SAILOR, RN Outcome: Progressing   Problem: Education: Goal: Ability to verbalize activity precautions or restrictions will improve 10/31/2023 0028 by Marvis Kenneth SAILOR, RN Outcome: Progressing 10/30/2023 2054 by Marvis Kenneth SAILOR, RN Outcome: Progressing Goal: Knowledge of the prescribed therapeutic regimen will improve 10/31/2023 0028 by Marvis Kenneth SAILOR, RN Outcome: Progressing 10/30/2023 2054 by Marvis Kenneth SAILOR, RN Outcome: Progressing Goal: Understanding of discharge needs will improve 10/31/2023 0028 by Marvis Kenneth SAILOR, RN Outcome: Progressing 10/30/2023 2054 by Marvis Kenneth SAILOR, RN Outcome: Progressing   Problem: Activity: Goal: Ability to avoid complications of mobility impairment will improve 10/31/2023  0028 by Marvis Kenneth SAILOR, RN Outcome: Progressing 10/30/2023 2054 by Marvis Kenneth SAILOR, RN Outcome: Progressing Goal: Ability to tolerate increased activity will improve 10/31/2023 0028 by Marvis Kenneth SAILOR, RN Outcome: Progressing 10/30/2023 2054 by Marvis Kenneth SAILOR, RN Outcome: Progressing Goal: Will remain free from falls 10/31/2023 0028 by Marvis Kenneth SAILOR, RN Outcome: Progressing 10/30/2023 2054 by  Marvis Kenneth SAILOR, RN Outcome: Progressing   Problem: Bowel/Gastric: Goal: Gastrointestinal status for postoperative course will improve 10/31/2023 0028 by Marvis Kenneth SAILOR, RN Outcome: Progressing 10/30/2023 2054 by Marvis Kenneth SAILOR, RN Outcome: Progressing   Problem: Clinical Measurements: Goal: Ability to maintain clinical measurements within normal limits will improve 10/31/2023 0028 by Marvis Kenneth SAILOR, RN Outcome: Progressing 10/30/2023 2054 by Marvis Kenneth SAILOR, RN Outcome: Progressing Goal: Postoperative complications will be avoided or minimized 10/31/2023 0028 by Marvis Kenneth SAILOR, RN Outcome: Progressing 10/30/2023 2054 by Marvis Kenneth SAILOR, RN Outcome: Progressing Goal: Diagnostic test results will improve 10/31/2023 0028 by Marvis Kenneth SAILOR, RN Outcome: Progressing 10/30/2023 2054 by Marvis Kenneth SAILOR, RN Outcome: Progressing   Problem: Pain Management: Goal: Pain level will decrease 10/31/2023 0028 by Marvis Kenneth SAILOR, RN Outcome: Progressing 10/30/2023 2054 by Marvis Kenneth SAILOR, RN Outcome: Progressing   Problem: Skin Integrity: Goal: Will show signs of wound healing 10/31/2023 0028 by Marvis Kenneth SAILOR, RN Outcome: Progressing 10/30/2023 2054 by Marvis Kenneth SAILOR, RN Outcome: Progressing   Problem: Health Behavior/Discharge Planning: Goal: Identification of resources available to assist in meeting health care needs will improve 10/31/2023 0028 by Marvis Kenneth SAILOR, RN Outcome: Progressing 10/30/2023 2054 by Marvis Kenneth SAILOR, RN Outcome: Progressing   Problem: Bladder/Genitourinary: Goal: Urinary functional status for postoperative course will improve 10/31/2023 0028 by Marvis Kenneth SAILOR, RN Outcome: Progressing 10/30/2023 2054 by Marvis Kenneth SAILOR, RN Outcome: Progressing

## 2023-10-31 NOTE — Progress Notes (Signed)
 Physical Therapy Treatment Patient Details Name: Dale Green MRN: [id number] DOB: 01/25/1955 Today's Date: 10/31/2023   History of Present Illness 68 yo male s/p C3-7 laminectomy with posterolateral facet screw fixation from C3-7, posterior cervical fusion C3-7 10/9. PMH includes BPH, CKD, gout, HLD, HTN, OA (per pt has needed L TKA x 10 yrs).    PT Comments  Patient immediately reports rough night due to stabbing left lateral chest pain (distal to axilla). Able to activate triceps without incr pain and no pain to palpation. Up to EOB with min assist (pt will sleep in electric recliner at home). While sitting and without movement a sudden pain hit him lasting ~3 seconds. Encouraged pt to transfer to chair for breakfast, however pt felt better once standing and wanted to ambulate. No lateral chest pain during ambulation,  however as preparing to sit on EOB, pain hit him. Returned to bed per RN request as they plan to do an EKG. Pt/wife report they did find someone to install his metal ramp over his 2 steps to enter home--not sure it is done yet, have not been able to confirm. From a mobility perspective, pt is moving well enough for discharge, however this new onset of severe pain that does not appear to be musculoskeletal is concerning.     If plan is discharge home, recommend the following: A little help with walking and/or transfers;A little help with bathing/dressing/bathroom;Assist for transportation;Help with stairs or ramp for entrance   Can travel by private vehicle        Equipment Recommendations  Rolling walker (2 wheels)    Recommendations for Other Services       Precautions / Restrictions Precautions Precautions: Fall;Cervical Precaution Booklet Issued: No Recall of Precautions/Restrictions: Intact Precaution/Restrictions Comments: pt able to verbalize precautions Restrictions Weight Bearing Restrictions Per Provider Order: No     Mobility  Bed Mobility Overal bed  mobility: Needs Assistance Bed Mobility: Rolling, Sidelying to Sit, Sit to Sidelying Rolling: Used rails, Supervision Sidelying to sit: Min assist, HOB elevated     Sit to sidelying: Min assist General bed mobility comments: pt will sleep in recliner at home; min assist to elevate trunk and then to elevate legs    Transfers Overall transfer level: Needs assistance Equipment used: Rolling walker (2 wheels) Transfers: Sit to/from Stand Sit to Stand: Contact guard assist           General transfer comment: Pt able to come to stand without physical assist    Ambulation/Gait Ambulation/Gait assistance: Contact guard assist Gait Distance (Feet): 150 Feet Assistive device: Rolling walker (2 wheels) Gait Pattern/deviations: Step-through pattern, Decreased stride length, Trunk flexed Gait velocity: decr     General Gait Details: close guard for safety, cues for upright posture, less pressure through UEs to reduce neck pain, and placement in RW; no lateral chest pain while walking feels better however as returning to sit another pain hit   Stairs         General stair comments: pt/wife confirm they have someone to install his ramp to enter house   Wheelchair Mobility     Tilt Bed    Modified Rankin (Stroke Patients Only)       Balance Overall balance assessment: Needs assistance Sitting-balance support: No upper extremity supported, Feet supported Sitting balance-Leahy Scale: Fair     Standing balance support: Bilateral upper extremity supported, During functional activity Standing balance-Leahy Scale: Poor Standing balance comment: reliant on external support post-op  Communication Communication Communication: No apparent difficulties  Cognition Arousal: Alert Behavior During Therapy: WFL for tasks assessed/performed   PT - Cognitive impairments: No apparent impairments                         Following  commands: Intact      Cueing Cueing Techniques: Verbal cues  Exercises      General Comments General comments (skin integrity, edema, etc.): Wife present      Pertinent Vitals/Pain Pain Assessment Pain Assessment: Faces Faces Pain Scale: Hurts worst Pain Location: Left lateral chest Pain Descriptors / Indicators: Stabbing, Moaning, Grimacing, Guarding Pain Intervention(s): Limited activity within patient's tolerance, Monitored during session, Premedicated before session, Repositioned    Home Living                          Prior Function            PT Goals (current goals can now be found in the care plan section) Acute Rehab PT Goals Patient Stated Goal: find out what's causing Left lateral chest pain PT Goal Formulation: With patient/family Time For Goal Achievement: 11/13/23 Potential to Achieve Goals: Good Progress towards PT goals: Progressing toward goals    Frequency    Min 5X/week      PT Plan      Co-evaluation              AM-PAC PT 6 Clicks Mobility   Outcome Measure  Help needed turning from your back to your side while in a flat bed without using bedrails?: A Little Help needed moving from lying on your back to sitting on the side of a flat bed without using bedrails?: A Little Help needed moving to and from a bed to a chair (including a wheelchair)?: A Little Help needed standing up from a chair using your arms (e.g., wheelchair or bedside chair)?: A Little Help needed to walk in hospital room?: A Little Help needed climbing 3-5 steps with a railing? : A Little 6 Click Score: 18    End of Session Equipment Utilized During Treatment: Gait belt Activity Tolerance: Patient tolerated treatment well Patient left: with call bell/phone within reach;with family/visitor present;in bed (return to bed as MD ordered EKG) Nurse Communication: Mobility status;Other (comment) (left lateral chest pain not related to arm movement or tender  to touch) PT Visit Diagnosis: Other abnormalities of gait and mobility (R26.89);Muscle weakness (generalized) (M62.81)     Time: 9180-9161 PT Time Calculation (min) (ACUTE ONLY): 19 min  Charges:    $Gait Training: 8-22 mins PT General Charges $$ ACUTE PT VISIT: 1 Visit                      Macario RAMAN, PT Acute Rehabilitation Services  Office [phone]    Macario SHAUNNA Soja 10/31/2023, 8:46 AM

## 2023-10-31 NOTE — Progress Notes (Signed)
 At the beginning of shift. Pt report sharp pains to his left flank/chest that radiates to his LUE making it difficult to fully move extremity. Pt denies chest pain, notes the pain starts with movement and elevating LUE on pillow help relieve him of the pain. VSS, EKG completed. PRN medication adm including Robaxin effective. Pt assisted OOB to BR x1. Pt resting comfortably in bed with spouse at bedside. MYRTIS Val Roof RN   10/31/23 0835  Vitals  BP (!) 148/62  MAP (mmHg) 88  Pulse Rate 99  Resp 16  Level of Consciousness  Level of Consciousness Alert  Oxygen  Therapy  SpO2 95 %  O2 Device Room Air

## 2023-11-01 ENCOUNTER — Encounter (HOSPITAL_COMMUNITY): Payer: Self-pay | Admitting: Neurological Surgery

## 2023-11-01 DIAGNOSIS — G959 Disease of spinal cord, unspecified: Secondary | ICD-10-CM | POA: Diagnosis not present

## 2023-11-01 LAB — CBC WITH DIFFERENTIAL/PLATELET
Abs Immature Granulocytes: 0.18 K/uL — ABNORMAL HIGH (ref 0.00–0.07)
Basophils Absolute: 0.1 K/uL (ref 0.0–0.1)
Basophils Relative: 0 %
Eosinophils Absolute: 0.1 K/uL (ref 0.0–0.5)
Eosinophils Relative: 0 %
HCT: 31 % — ABNORMAL LOW (ref 39.0–52.0)
Hemoglobin: 10 g/dL — ABNORMAL LOW (ref 13.0–17.0)
Immature Granulocytes: 1 %
Lymphocytes Relative: 14 %
Lymphs Abs: 2 K/uL (ref 0.7–4.0)
MCH: 29.2 pg (ref 26.0–34.0)
MCHC: 32.3 g/dL (ref 30.0–36.0)
MCV: 90.4 fL (ref 80.0–100.0)
Monocytes Absolute: 1.5 K/uL — ABNORMAL HIGH (ref 0.1–1.0)
Monocytes Relative: 11 %
Neutro Abs: 10.8 K/uL — ABNORMAL HIGH (ref 1.7–7.7)
Neutrophils Relative %: 74 %
Platelets: 182 K/uL (ref 150–400)
RBC: 3.43 MIL/uL — ABNORMAL LOW (ref 4.22–5.81)
RDW: 14.2 % (ref 11.5–15.5)
WBC: 14.6 K/uL — ABNORMAL HIGH (ref 4.0–10.5)
nRBC: 0 % (ref 0.0–0.2)

## 2023-11-01 LAB — COMPREHENSIVE METABOLIC PANEL WITH GFR
ALT: 20 U/L (ref 0–44)
AST: 28 U/L (ref 15–41)
Albumin: 2.6 g/dL — ABNORMAL LOW (ref 3.5–5.0)
Alkaline Phosphatase: 72 U/L (ref 38–126)
Anion gap: 9 (ref 5–15)
BUN: 27 mg/dL — ABNORMAL HIGH (ref 8–23)
CO2: 27 mmol/L (ref 22–32)
Calcium: 8.4 mg/dL — ABNORMAL LOW (ref 8.9–10.3)
Chloride: 100 mmol/L (ref 98–111)
Creatinine, Ser: 1.87 mg/dL — ABNORMAL HIGH (ref 0.61–1.24)
GFR, Estimated: 38 mL/min — ABNORMAL LOW (ref >60)
Glucose, Bld: 125 mg/dL — ABNORMAL HIGH (ref 70–99)
Potassium: 4.1 mmol/L (ref 3.5–5.1)
Sodium: 136 mmol/L (ref 135–145)
Total Bilirubin: 0.8 mg/dL (ref 0.0–1.2)
Total Protein: 6.5 g/dL (ref 6.5–8.1)

## 2023-11-01 LAB — D-DIMER, QUANTITATIVE: D-Dimer, Quant: 2.02 ug{FEU}/mL — ABNORMAL HIGH (ref 0.00–0.50)

## 2023-11-01 MED ORDER — ACETAMINOPHEN 500 MG PO TABS
1000.0000 mg | ORAL_TABLET | Freq: Three times a day (TID) | ORAL | Status: DC
  Administered 2023-11-01 – 2023-11-05 (×12): 1000 mg via ORAL
  Filled 2023-11-01 (×12): qty 2

## 2023-11-01 MED ORDER — GABAPENTIN 300 MG PO CAPS
300.0000 mg | ORAL_CAPSULE | Freq: Three times a day (TID) | ORAL | Status: DC
  Administered 2023-11-01 – 2023-11-05 (×12): 300 mg via ORAL
  Filled 2023-11-01 (×12): qty 1

## 2023-11-01 MED ORDER — SODIUM CHLORIDE 0.9 % IV SOLN
1.0000 g | INTRAVENOUS | Status: DC
  Administered 2023-11-01 – 2023-11-04 (×4): 1 g via INTRAVENOUS
  Filled 2023-11-01 (×5): qty 10

## 2023-11-01 MED ORDER — METHOCARBAMOL 500 MG PO TABS
500.0000 mg | ORAL_TABLET | Freq: Three times a day (TID) | ORAL | Status: DC
  Administered 2023-11-01 – 2023-11-05 (×12): 500 mg via ORAL
  Filled 2023-11-01 (×12): qty 1

## 2023-11-01 NOTE — Progress Notes (Signed)
 Patient ID: Dale Green, male   DOB: March 21, 1955, 67 y.o.   MRN: [id number] Vital signs are stable however patient having sharp acute pains in the left chest wall and under her armpit that have been going on now for the past 24 hours.  An EKG was done yesterday which is nonspecific patient notes that he typically gets easily short of breath when walking and this has been the case here.  He feels his left arm may have some weakness and it I am concerned about the possibility of a acute pulmonary embolus and I will ask the hospitalist to see the patient he has been febrile and has been started on Ancef today this may be related to his urinary catheter.  Appreciate the help of urology consultant.  I changed the dressing today on his neck and it appears clean there are 2 spots that are having some bleedthrough 1 is from the drain site.  These were dressed with Betadine and dry gauze.

## 2023-11-01 NOTE — Consult Note (Signed)
 Urology Consult Note   Requesting Attending Physician:  Colon Shove, MD Service Providing Consult: Urology  Consulting Attending: Dr. Devere   Reason for Consult:  intraoperative difficult foley placement, urethral stricture  HPI: Dale Green is seen in consultation for reasons noted above at the request of Colon Shove, MD. Patient is a 68 y.o. male presenting to [facility] for scheduled laminectomy and C3-C7 posterior fixation with Dr. Colon and treatment of progressive neck, shoulder, arm discomfort, and lower extremity stability issues.  Foley catheter was able to be placed intraoperatively which ran into urethral stricture, previously undiagnosed, creating a false passage and traumatic bleeding.  [facility] was called to the OR room.  The patient was already under anesthesia and ultimately required lengthy cystoscopy with removal of ragged tissue and clot material, then urethral dilation.  Please see separate procedure note.  Now that the patient was alert and oriented, I was able to review the case and plan with he and his wife at the bedside.  He is followed by Dr. Watt of our practice for symptoms of BPH and most recently had a prostate biopsy without malignant findings, though the urinalysis did show some unexpected microscopic hematuria that was to be worked up in the near future.  As of this morning he is still having quite a bit of postoperative difficulty related to his C-spine fusion.  Thankfully his urine is clear yellow.  He is still seeing scant blood per urethra when he strains, either getting out of bed or attempting to have a bowel movement. ------------------  Assessment:   68 y.o. male presenting for spinal fusion with urethral stricture, hematuria, and traumatic intraoperative catheter placement.   Recommendations: #Urethral stricture #foley trauma # urethral meatus pain  S/p cystourethroscopy with serial dilation of urethral stricture.  Foley  catheter in place.  7 days total for proper epithelialization and stretch injury of bladder.  Will attempt voiding trial in hospital if he remains admitted for the next couple of days.  If not, family will schedule voiding trial outpatient with our practice.  Some fever and leukocytosis.  Reviewed with primary team and will start empiric Rocephin  this morning.  Patient reports not having had bowel movement since Wednesday of last week.  Provided with Senokot and MiraLAX and was able to have a bowel movement yesterday.  He does report continued straining though.  Continue laxatives.  Bearing-down well perpetuate his urethral bleeding. He reports seeing fresh blood while straining.  Bacitracin ointment to urethral meatus TID. Pt reports resolution of stinging.  Continue Flomax.   Urology will follow peripherally. Please call with questions.    Case and plan discussed with Dr. Devere  Past Medical History: Past Medical History:  Diagnosis Date   BPH (benign prostatic hyperplasia)    Chronic kidney disease    Gout    Hyperlipidemia    Hypertension    Osteoarthritis     Past Surgical History:  Past Surgical History:  Procedure Laterality Date   BIOPSY  05/29/2020   Procedure: BIOPSY;  Surgeon: Eartha Angelia Sieving, MD;  [facility]: [facility];  Service: Gastroenterology;;   COLONOSCOPY WITH PROPOFOL  N/A 05/29/2020   Procedure: COLONOSCOPY WITH PROPOFOL ;  Surgeon: Eartha Angelia Sieving, MD;  Location: [facility];  Service: Gastroenterology;  Laterality: N/A;  11:00   POLYPECTOMY  05/29/2020   Procedure: POLYPECTOMY;  Surgeon: Eartha Angelia Sieving, MD;  Location: [facility];  Service: Gastroenterology;;   TOOTH EXTRACTION     ureathral  surgery  1970    Medication: Current Facility-Administered Medications  Medication Dose Route Frequency Provider Last Rate Last Admin   acetaminophen  (TYLENOL ) tablet 650 mg  650 mg Oral Q4H PRN Colon Shove, MD   650 mg at  10/31/23 9096   Or   acetaminophen  (TYLENOL ) suppository 650 mg  650 mg Rectal Q4H PRN Colon Shove, MD       allopurinol  (ZYLOPRIM ) tablet 300 mg  300 mg Oral q AM Colon Shove, MD   300 mg at 11/01/23 0604   bacitracin ointment   Topical TID Delia Ole ORN, NP   31.5 Application at 10/31/23 2211   bisacodyl (DULCOLAX) EC tablet 5-10 mg  5-10 mg Oral Daily PRN Colon Shove, MD   10 mg at 10/31/23 1250   bisacodyl (DULCOLAX) suppository 10 mg  10 mg Rectal Daily PRN Colon Shove, MD       cefTRIAXone  (ROCEPHIN ) 1 g in sodium chloride  0.9 % 100 mL IVPB  1 g Intravenous Q24H Delia Ole ORN, NP       Chlorhexidine Gluconate Cloth 2 % PADS 6 each  6 each Topical Daily Colon Shove, MD   6 each at 10/31/23 0905   docusate sodium (COLACE) capsule 100 mg  100 mg Oral BID Elsner, Henry, MD   100 mg at 10/31/23 2211   HYDROmorphone (DILAUDID) injection 1 mg  1 mg Intravenous Q2H PRN Colon Shove, MD   1 mg at 11/01/23 0605   menthol (CEPACOL) lozenge 3 mg  1 lozenge Oral PRN Colon Shove, MD       Or   phenol (CHLORASEPTIC) mouth spray 1 spray  1 spray Mouth/Throat PRN Colon Shove, MD       methocarbamol (ROBAXIN) tablet 500 mg  500 mg Oral Q6H PRN Colon Shove, MD   500 mg at 10/31/23 0044   Or   methocarbamol (ROBAXIN) injection 500 mg  500 mg Intravenous Q6H PRN Colon Shove, MD   500 mg at 10/31/23 2009   ondansetron  (ZOFRAN ) tablet 4 mg  4 mg Oral Q6H PRN Colon Shove, MD       Or   ondansetron  (ZOFRAN ) injection 4 mg  4 mg Intravenous Q6H PRN Colon Shove, MD       oxyCODONE-acetaminophen  (PERCOCET/ROXICET) 5-325 MG per tablet 1-2 tablet  1-2 tablet Oral Q6H PRN Colon Shove, MD   2 tablet at 10/31/23 1946   polyethylene glycol (MIRALAX / GLYCOLAX) packet 17 g  17 g Oral Daily PRN Colon Shove, MD       rosuvastatin (CRESTOR) tablet 20 mg  20 mg Oral q AM Colon Shove, MD   20 mg at 11/01/23 0604   senna (SENOKOT) tablet 8.6 mg  1 tablet Oral BID Colon Shove, MD    8.6 mg at 10/31/23 2211   sodium chloride  flush (NS) 0.9 % injection 3 mL  3 mL Intravenous Q12H Elsner, Henry, MD   3 mL at 10/31/23 2215   sodium chloride  flush (NS) 0.9 % injection 3 mL  3 mL Intravenous PRN Colon Shove, MD       sodium phosphate (FLEET) enema 1 enema  1 enema Rectal Once PRN Colon Shove, MD       tamsulosin (FLOMAX) capsule 0.4 mg  0.4 mg Oral q AM Colon Shove, MD   0.4 mg at 11/01/23 0604    Allergies: No Known Allergies  Social History: Social History   Tobacco Use   Smoking status: Former   Smokeless tobacco: Never  Vaping Use   Vaping status: Never Used  Substance Use Topics   Alcohol use: No   Drug use: No    Family History Family History  Problem Relation Age of Onset   Healthy Mother    Heart disease Father    Healthy Brother     Review of Systems  Genitourinary:  Negative for dysuria, flank pain, frequency, hematuria and urgency.     Objective   Vital signs in last 24 hours: BP (!) 144/70 (BP Location: Right Arm)   Pulse 80   Temp 98.3 F (36.8 C) (Oral)   Resp 16   Ht 5' 10 (1.778 m)   Wt 130.6 kg   SpO2 92%   BMI 41.32 kg/m   Physical Exam General: A&O, resting, appropriate HEENT: [place]/AT Pulmonary: Normal work of breathing Cardiovascular: no cyanosis Abdomen: Soft, NTTP, nondistended GU: foley catheter draining clear yellow urine, scant bleeding per urethra   Most Recent Labs: Lab Results  Component Value Date   WBC 25.4 (H) 10/29/2023   HGB 11.2 (L) 10/29/2023   HCT 35.1 (L) 10/29/2023   PLT 209 10/29/2023    Lab Results  Component Value Date   NA 138 10/29/2023   K 4.9 10/29/2023   CL 102 10/29/2023   CO2 25 10/29/2023   BUN 28 (H) 10/29/2023   CREATININE 2.16 (H) 10/29/2023   CALCIUM 8.3 (L) 10/29/2023    No results found for: INR, APTT   Urine Culture: @[id number] (laburin,org,r9620,r9621)@   IMAGING: No results found.  ------  Ole Bourdon, NP Pager: [phone]   Please  contact the urology consult pager with any further questions/concerns.

## 2023-11-01 NOTE — Care Management Important Message (Signed)
 Important Message  Patient Details  Name: Dale Green MRN: [id number] Date of Birth: 1955/02/20   Important Message Given:  Yes - Medicare IM     Jon Cruel 11/01/2023, 4:43 PM

## 2023-11-01 NOTE — Progress Notes (Signed)
 Physical Therapy Treatment Patient Details Name: Dale Green MRN: [id number] DOB: Dec 22, 1955 Today's Date: 11/01/2023   History of Present Illness 68 yo male s/p C3-7 laminectomy with posterolateral facet screw fixation from C3-7, posterior cervical fusion C3-7 10/9. PMH includes BPH, CKD, gout, HLD, HTN, OA (per pt has needed L TKA x 10 yrs).    PT Comments  Patient reports another rough night with pain and fever. Continues to have sharp pains along left lateral chest wall. Per RN, MD thinks possibly due to positioning during surgery? Patient continues to progress with his mobility toward modified independence with use of RW. He has had ramp installed for entry into home (wife had pictures). Based on excellent functional progress, decreasing PT frequency to 3x/week.     If plan is discharge home, recommend the following: A little help with walking and/or transfers;A little help with bathing/dressing/bathroom;Assist for transportation;Help with stairs or ramp for entrance   Can travel by private vehicle        Equipment Recommendations  Rolling walker (2 wheels)    Recommendations for Other Services       Precautions / Restrictions Precautions Precautions: Fall;Cervical Precaution Booklet Issued: No Recall of Precautions/Restrictions: Intact Precaution/Restrictions Comments: pt able to verbalize precautions Restrictions Weight Bearing Restrictions Per Provider Order: No     Mobility  Bed Mobility Overal bed mobility: Needs Assistance Bed Mobility: Rolling, Sidelying to Sit, Sit to Sidelying Rolling: Used rails, Supervision Sidelying to sit: HOB elevated, Supervision       General bed mobility comments: pt will sleep in recliner at home therefore bed features used to assist pt be more independent    Transfers Overall transfer level: Needs assistance Equipment used: Rolling walker (2 wheels) Transfers: Sit to/from Stand Sit to Stand: Contact guard assist            General transfer comment: Pt able to come to stand without physical assist    Ambulation/Gait Ambulation/Gait assistance: Contact guard assist Gait Distance (Feet): 180 Feet Assistive device: Rolling walker (2 wheels) Gait Pattern/deviations: Step-through pattern, Decreased stride length, Trunk flexed Gait velocity: decr     General Gait Details: close guard for safety due to sporadic, sudden onset of pain, cues for upright posture, less pressure through UEs to reduce neck pain, and placement in RW; one episode of lateral chest pain while walking   Stairs         General stair comments: wife had pictures of ramp that is now Leisure centre manager     Tilt Bed    Modified Rankin (Stroke Patients Only)       Balance Overall balance assessment: Needs assistance Sitting-balance support: No upper extremity supported, Feet supported Sitting balance-Leahy Scale: Fair     Standing balance support: Bilateral upper extremity supported, During functional activity Standing balance-Leahy Scale: Poor Standing balance comment: reliant on external support post-op                            Communication Communication Communication: No apparent difficulties  Cognition Arousal: Alert Behavior During Therapy: WFL for tasks assessed/performed   PT - Cognitive impairments: No apparent impairments                         Following commands: Intact      Cueing Cueing Techniques: Verbal cues  Exercises      General Comments General comments (skin integrity, edema, etc.): Wife present.  Pertinent Vitals/Pain Pain Assessment Pain Assessment: Faces Faces Pain Scale: Hurts worst Pain Location: Left lateral chest Pain Descriptors / Indicators: Stabbing, Moaning, Grimacing, Guarding Pain Intervention(s): Limited activity within patient's tolerance, Monitored during session, Patient requesting pain meds-RN notified    Home Living                           Prior Function            PT Goals (current goals can now be found in the care plan section) Acute Rehab PT Goals Patient Stated Goal: find out what's causing Left lateral chest pain Time For Goal Achievement: 11/13/23 Potential to Achieve Goals: Good Progress towards PT goals: Progressing toward goals    Frequency    Min 3X/week      PT Plan      Co-evaluation              AM-PAC PT 6 Clicks Mobility   Outcome Measure  Help needed turning from your back to your side while in a flat bed without using bedrails?: A Little Help needed moving from lying on your back to sitting on the side of a flat bed without using bedrails?: A Little Help needed moving to and from a bed to a chair (including a wheelchair)?: A Little Help needed standing up from a chair using your arms (e.g., wheelchair or bedside chair)?: A Little Help needed to walk in hospital room?: A Little Help needed climbing 3-5 steps with a railing? : A Little 6 Click Score: 18    End of Session Equipment Utilized During Treatment: Gait belt Activity Tolerance: Patient tolerated treatment well Patient left: with call bell/phone within reach;with family/visitor present;in chair (return to bed as MD ordered EKG) Nurse Communication: Mobility status;Patient requests pain meds PT Visit Diagnosis: Other abnormalities of gait and mobility (R26.89);Muscle weakness (generalized) (M62.81)     Time: 9050-8993 PT Time Calculation (min) (ACUTE ONLY): 17 min  Charges:    $Gait Training: 8-22 mins PT General Charges $$ ACUTE PT VISIT: 1 Visit                      Macario RAMAN, PT Acute Rehabilitation Services  Office [phone]    Macario SHAUNNA Soja 11/01/2023, 11:00 AM

## 2023-11-01 NOTE — Plan of Care (Signed)

## 2023-11-01 NOTE — Consult Note (Signed)
 Initial Consultation Note   Patient: Dale Green FMW:[id number] DOB: 10-31-1955 PCP: Bertell Satterfield, MD DOA: 10/28/2023 DOS: the patient was seen and examined on 11/01/2023 Primary service: Colon Shove, MD  Referring physician: Shove Colon, MD Reason for consult: L chest pain and DOE  Assessment/Plan: Assessment and Plan: 71M h/o HTN, HLD, OA and BPH who is s/p C3-7 PCDF on 10/9; now w/ L sided cp and DOE.   L chest pain and DOE High suspicion for musculoskeletal pain from positioning during surgery since pt noted immediate pain post-op and pain worsens with any resistance (suggesting muscular etiology); DOE attributed to overall physical deconditioning -PT/OT consulted; apprec muscle strengthening exercises for LUE -Start multimodal pain control w/ PO tylenol  1g TID, robaxin 500mg  TID, and gabapentin 300mg  TID -F/u D-dimer; if positive, consider CTA  chest to exclude PE   TRH will sign off at present, please call us  again when needed.   HPI: Dale Green is a 68 y.o. male with past medical history of HTN, HLD, OA and BPH who is s/p C3-7 PCDF on 10/9; no with L sided cp.   The patient reported experiencing chest pain that originated under the arm and extended from the joint down the side. The pain began immediately following surgery on Thursday, which the surgical team suggested might be related to the positioning during the procedure. The patient was prescribed pain medication and muscle relaxants, which provided relief, but the pain persisted intermittently. The pain was described as stabbing at its worst, with a severity of 7 out of 10, making breathing difficult. Positioning the arm to reduce stress alleviated the pain, while raising the arm above the head was possible after taking pain medication. The patient had been using ice to manage the discomfort. There were no issues with breathing, and the pain did not worsen with deep breaths. Per wife, the patient's blood pressure  had been elevated in the evenings, potentially due to the pain.   Review of Systems: As mentioned in the history of present illness. All other systems reviewed and are negative. Past Medical History:  Diagnosis Date   BPH (benign prostatic hyperplasia)    Chronic kidney disease    Gout    Hyperlipidemia    Hypertension    Osteoarthritis    Past Surgical History:  Procedure Laterality Date   BIOPSY  05/29/2020   Procedure: BIOPSY;  Surgeon: Eartha Angelia Sieving, MD;  [facility];  Service: Gastroenterology;;   COLONOSCOPY WITH PROPOFOL  N/A 05/29/2020   Procedure: COLONOSCOPY WITH PROPOFOL ;  Surgeon: Eartha Angelia Sieving, MD;  [facility]: [facility];  Service: Gastroenterology;  Laterality: N/A;  11:00   POLYPECTOMY  05/29/2020   Procedure: POLYPECTOMY;  Surgeon: Eartha Angelia, Sieving, MD;  [facility]: [facility];  Service: Gastroenterology;;   POSTERIOR CERVICAL LAMINECTOMY N/A 10/28/2023   Procedure: Posterior cervical laminectomy multiple level cervical three - cervical seven with posterior cervical fusion with facet screws;  Surgeon: Colon Shove, MD;  Location: [facility];  Service: Neurosurgery;  Laterality: N/A;  Post cervical lami/multi level C3-C7 with posterior cervical fusion with facet screws   TOOTH EXTRACTION     ureathral surgery  1970   Social History:  reports that he has quit smoking. He has never used smokeless tobacco. He reports that he does not drink alcohol and does not use drugs.  No Known Allergies  Family History  Problem Relation Age of Onset   Healthy Mother    Heart disease Father    Healthy  Brother     Prior to Admission medications   Medication Sig Start Date End Date Taking? Authorizing Provider  allopurinol  (ZYLOPRIM ) 300 MG tablet Take 300 mg by mouth in the morning. 11/03/16  Yes [provider]  cephALEXin (KEFLEX) 500 MG capsule Take 1 capsule (500 mg total) by mouth 3 (three) times daily. 10/29/23  Yes Colon Shove, MD  Cholecalciferol (VITAMIN D3 PO) Take 5,000 Units by mouth in the morning.   Yes [provider]  Flaxseed, Linseed, (FLAXSEED OIL PO) Take 1,000 mg by mouth in the morning.   Yes [provider]  Methylsulfonylmethane (MSM PO) Take 1,000 mg by mouth in the morning.   Yes [provider]  penicillin v potassium (VEETID) 500 MG tablet Take 500 mg by mouth 4 (four) times daily.   Yes [provider]  rosuvastatin (CRESTOR) 20 MG tablet Take 20 mg by mouth in the morning.   Yes [provider]  tamsulosin (FLOMAX) 0.4 MG CAPS capsule Take 0.4 mg by mouth in the morning.   Yes [provider]  methocarbamol (ROBAXIN) 500 MG tablet Take 1 tablet (500 mg total) by mouth every 6 (six) hours as needed for muscle spasms. 10/29/23   Colon Shove, MD  oxyCODONE-acetaminophen  (PERCOCET/ROXICET) 5-325 MG tablet Take 1-2 tablets by mouth every 6 (six) hours as needed for moderate pain (pain score 4-6) or severe pain (pain score 7-10). 10/29/23   Colon Shove, MD    Physical Exam: Vitals:   10/31/23 2306 11/01/23 0342 11/01/23 0811 11/01/23 1124  BP: (!) 166/64 (!) 154/56 (!) 144/70 138/67  Pulse: 92 81 80   Resp: 20 17 16    Temp: 99.5 F (37.5 C) 99 F (37.2 C) 98.3 F (36.8 C) 98.9 F (37.2 C)  TempSrc: Oral Oral Oral Oral  SpO2: 92% 92%    Weight:      Height:       General: Alert, oriented x3, resting comfortably in no acute distress Respiratory: Lungs clear to auscultation bilaterally with normal respiratory effort; no w/r/r Cardiovascular: Regular rate and rhythm w/o m/r/g MSK: LUE with immediate pain w resistance; nl ROM otherwise   Data Reviewed:   Lab Results  Component Value Date   WBC 14.6 (H) 11/01/2023   HGB 10.0 (L) 11/01/2023   HCT 31.0 (L) 11/01/2023   MCV 90.4 11/01/2023   PLT 182 11/01/2023   Lab Results  Component Value Date   GLUCOSE 125 (H) 11/01/2023   CALCIUM 8.4 (L) 11/01/2023   NA 136 11/01/2023    K 4.1 11/01/2023   CO2 27 11/01/2023   CL 100 11/01/2023   BUN 27 (H) 11/01/2023   CREATININE 1.87 (H) 11/01/2023   Lab Results  Component Value Date   ALT 20 11/01/2023   AST 28 11/01/2023   ALKPHOS 72 11/01/2023   BILITOT 0.8 11/01/2023   No results found for: INR, PROTIME Radiology: No results found.   Family Communication: Wife   Thank you very much for involving us  in the care of your patient.  Author: Marsha Ada, MD 11/01/2023 1:39 PM  For on call review [email or web address].

## 2023-11-02 ENCOUNTER — Inpatient Hospital Stay (HOSPITAL_COMMUNITY)

## 2023-11-02 DIAGNOSIS — G959 Disease of spinal cord, unspecified: Secondary | ICD-10-CM | POA: Diagnosis not present

## 2023-11-02 MED ORDER — OXYCODONE HCL 5 MG PO TABS
5.0000 mg | ORAL_TABLET | Freq: Four times a day (QID) | ORAL | Status: DC | PRN
  Administered 2023-11-05: 5 mg via ORAL
  Filled 2023-11-02 (×2): qty 1

## 2023-11-02 MED ORDER — IOHEXOL 350 MG/ML SOLN
75.0000 mL | Freq: Once | INTRAVENOUS | Status: AC | PRN
  Administered 2023-11-02: 75 mL via INTRAVENOUS

## 2023-11-02 NOTE — Progress Notes (Signed)
 Occupational Therapy Treatment Patient Details Name: Dale Green MRN: [id number] DOB: 1955-08-03 Today's Date: 11/02/2023   History of present illness 68 yo male s/p C3-7 laminectomy with posterolateral facet screw fixation from C3-7, posterior cervical fusion C3-7 10/9. PMH includes BPH, CKD, gout, HLD, HTN, OA (per pt has needed L TKA x 10 yrs).   OT comments  Pt is making steady progress towards their acute OT goals. He remains limited by painful shooting pains and spasms under his L arm. Visible muscle spasms noted throughout that were not painful. He is able to transfer and mobilize with up to CGA and RW use, HR to 130s with hallway mobility. He is still unable to reach his bilateral feet but states his wife will assist with LB ADLs at discharge. OT to continue to follow acutely to facilitate progress towards established goals. Pt will continue to benefit from discharge home with assist from family.       If plan is discharge home, recommend the following:  A little help with walking and/or transfers;A little help with bathing/dressing/bathroom;Assistance with cooking/housework;Assist for transportation   Equipment Recommendations  Other (comment)       Precautions / Restrictions Precautions Precautions: Fall;Cervical Precaution Booklet Issued: No Recall of Precautions/Restrictions: Intact Precaution/Restrictions Comments: pt able to verbalize precautions Restrictions Weight Bearing Restrictions Per Provider Order: No       Mobility Bed Mobility Overal bed mobility: Needs Assistance Bed Mobility: Rolling, Sidelying to Sit Rolling: Supervision Sidelying to sit: Min assist            Transfers Overall transfer level: Needs assistance Equipment used: Rolling walker (2 wheels) Transfers: Sit to/from Stand Sit to Stand: Contact guard assist                 Balance Overall balance assessment: Needs assistance Sitting-balance support: No upper extremity  supported, Feet supported Sitting balance-Leahy Scale: Fair     Standing balance support: Bilateral upper extremity supported, During functional activity Standing balance-Leahy Scale: Poor                             ADL either performed or assessed with clinical judgement   ADL Overall ADL's : Needs assistance/impaired Eating/Feeding: Independent   Grooming: Supervision/safety;Standing               Lower Body Dressing: Minimal assistance;Sit to/from stand Lower Body Dressing Details (indicate cue type and reason): pt's wife will assist at discharge Toilet Transfer: Contact guard assist;Ambulation;Regular Toilet;Rolling walker (2 wheels) Toilet Transfer Details (indicate cue type and reason): cues for RW mgmt and hand placement         Functional mobility during ADLs: Contact guard assist;Rolling walker (2 wheels) General ADL Comments: limited by 3x painful spasms at L lateral chest, activity tolerance and general debility. He is not able to get into figure four position, he states his wife will assist with LB ADLs    Extremity/Trunk Assessment Upper Extremity Assessment Upper Extremity Assessment: LUE deficits/detail;RUE deficits/detail RUE Deficits / Details: paraestheias throughout 1-3rd digits, pt stated no change from before surgery RUE Sensation: decreased light touch RUE Coordination: decreased fine motor LUE Deficits / Details: visable muscle twitching intermittently, not exacerbated by movement or relaxing. Pt experienced 3x painful spasms during mobilty   Lower Extremity Assessment Lower Extremity Assessment: Defer to PT evaluation        Vision       Perception Perception Perception: Within Functional Limits  Praxis Praxis Praxis: [facility]   Communication Communication Communication: No apparent difficulties   Cognition Arousal: Alert Behavior During Therapy: WFL for tasks assessed/performed Cognition: No apparent impairments                                Following commands: Intact        Cueing   Cueing Techniques: Verbal cues        General Comments VSS, HR to 120s with walkign    Pertinent Vitals/ Pain       Pain Assessment Pain Assessment: Faces Faces Pain Scale: Hurts whole lot Pain Location: L lateral chest/under arm Pain Descriptors / Indicators: Stabbing, Moaning, Grimacing, Guarding Pain Intervention(s): Limited activity within patient's tolerance, Monitored during session   Frequency  Min 2X/week        Progress Toward Goals  OT Goals(current goals can now be found in the care plan section)  Progress towards OT goals: Progressing toward goals  Acute Rehab OT Goals Patient Stated Goal: less pain OT Goal Formulation: With patient Time For Goal Achievement: 11/12/23 Potential to Achieve Goals: Good ADL Goals Pt Will Perform Grooming: with modified independence;standing Pt Will Perform Lower Body Dressing: with modified independence;sit to/from stand Pt Will Transfer to Toilet: with modified independence;ambulating   AM-PAC OT 6 Clicks Daily Activity     Outcome Measure   Help from another person eating meals?: None Help from another person taking care of personal grooming?: A Little Help from another person toileting, which includes using toliet, bedpan, or urinal?: A Little Help from another person bathing (including washing, rinsing, drying)?: A Little Help from another person to put on and taking off regular upper body clothing?: A Little Help from another person to put on and taking off regular lower body clothing?: A Little 6 Click Score: 19    End of Session Equipment Utilized During Treatment: Rolling walker (2 wheels)  OT Visit Diagnosis: Unsteadiness on feet (R26.81);Other abnormalities of gait and mobility (R26.89);Muscle weakness (generalized) (M62.81)   Activity Tolerance Patient tolerated treatment well   Patient Left in chair;with call bell/phone  within reach   Nurse Communication Mobility status        Time: 9044-8984 OT Time Calculation (min): 20 min  Charges: OT General Charges $OT Visit: 1 Visit OT Treatments $Self Care/Home Management : 8-22 mins  Lucie Kendall, OTR/L Acute Rehabilitation Services Office [phone] Secure Chat Communication Preferred   Lucie JONETTA Kendall 11/02/2023, 10:34 AM

## 2023-11-02 NOTE — Progress Notes (Signed)
 PROGRESS NOTE  Dale Green  DOB: 10/12/1955  PCP: Bertell Satterfield, MD FMW:[id number]  DOA: 10/28/2023  LOS: 5 days  Hospital Day: 6  Subjective: Patient was seen and examined this morning. Pleasant elderly Caucasian male.  Sitting up in recliner. Complains of left arm, left lateral chest wall pain which is worse on any movement of the left shoulder. Noted a plan of CT angio chest, ordered by neurosurgery this morning  Brief narrative: Dale Green is a 68 y.o. male with PMH significant for HTN, HLD, CKD, BPH, osteoarthritis. Over the last several weeks, patient had progressive neck, shoulder and arm discomfort.  He was seen by neurosurgery as an outpatient.   MRI of cervical spine in May 2025 showed advanced spondylitic stenosis at multiple levels spanning from C3-C7. CT scan in July 2025 showed severely restricted ligament and posterior spine.  10/9, he underwent elective C3-7 PCDF on 10/9 by neurosurgery Dr. Colon. While in the OR, patient was also seen by urology for difficult catheter placement. Patient was admitted under neurosurgery postoperatively. Postoperatively seen by PT for impaired mobility  10/13, patient complained of left-sided chest pain, dyspnea on exertion.  Hospitalist service was consulted for the concern of pulm embolism.  Assessment and plan: L chest pain and DOE Elevated D-dimer On exam this morning 10/14, patient complains of persistent left  arm, left lateral chest wall pain which is worse on any movement of the left shoulder. I suspect musculoskeletal pain, may or may not be related to recent surgical intervention. Given elevated D-dimer 2.02, neurosurgery has ordered CT angio chest to rule out PE.  Will follow-up result Not on supplemental oxygen . Impaired mobility mostly because of inability to use her walker due to left upper extremity pain. Pain regimen --- Scheduled: Neurontin 300 mg 3 times daily, Robaxin 500 mg 3 times daily, Tylenol  1 g  3 times daily --- PRN: IV Dilaudid 1 mg every 2 hours, oxycodone 5 mg every 6 hours  Cervical myelopathy Imagings as above. S/p C3-7 PCDF on 10/9 by neurosurgery Dr. Colon. PT following   Acute urinary retention H/o BPH While in the OR, patient was also seen by urology for difficult catheter placement. Noted plan from urology to continue catheter at discharge and follow-up at [facility] for voiding trial. Continue Flomax  Leukocytosis WBC count was elevated 25K on 10/10. Gradually improved.  Noted antibiotics switched to IV Rocephin  by urology on 10/13. Continue to monitor Recent Labs  Lab 10/29/23 0509 11/01/23 1212  WBC 25.4* 14.6*   AKI on CKD 3b Baseline creatinine 1.75 from 10/2.  Postoperatively, creatinine worsened to 2.16. Has improved with IV fluid.  Continue to monitor Recent Labs    10/21/23 0930 10/29/23 0509 11/01/23 1212  BUN 16 28* 27*  CREATININE 1.75* 2.16* 1.87*  CO2 26 25 27     Hypertension Not on home meds.  Blood pressure currently running in 140s and 150s. Continue to monitor.  IV hydralazine as needed  HLD Continue Crestor  Gout Continue allopurinol  300 mg daily    Mobility:  PT Orders: Active   PT Follow up Rec: Follow Physician's Recommendations For Discharge Plan And Follow Up Therapies10/13/2025 1010   Goals of care   Code Status: Full Code     DVT prophylaxis:  SCD's Start: 10/28/23 1819   Antimicrobials: IV Rocephin  Fluid: None Family Communication: None at bedside  Status: Patient Level of care:  Med-Surg   Patient is from: Home Needs to continue in-hospital care: Ongoing workup Anticipated d/c  to: Pending clinical course   Diet:  Diet Order             Diet - low sodium heart healthy           Diet regular Room service appropriate? Yes with Assist; Fluid consistency: Thin  Diet effective now                   Scheduled Meds:  acetaminophen   1,000 mg Oral TID   allopurinol   300 mg Oral q  AM   bacitracin   Topical TID   Chlorhexidine Gluconate Cloth  6 each Topical Daily   docusate sodium  100 mg Oral BID   gabapentin  300 mg Oral TID   methocarbamol  500 mg Oral TID   rosuvastatin  20 mg Oral q AM   senna  1 tablet Oral BID   sodium chloride  flush  3 mL Intravenous Q12H   tamsulosin  0.4 mg Oral q AM    PRN meds: bisacodyl, bisacodyl, HYDROmorphone (DILAUDID) injection, menthol **OR** phenol, ondansetron  **OR** ondansetron  (ZOFRAN ) IV, oxyCODONE, polyethylene glycol, sodium chloride  flush, sodium phosphate   Infusions:   cefTRIAXone  (ROCEPHIN )  IV 1 g (11/02/23 0929)    Antimicrobials: Anti-infectives (From admission, onward)    Start     Dose/Rate Route Frequency Ordered Stop   11/01/23 1000  cefTRIAXone  (ROCEPHIN ) 1 g in sodium chloride  0.9 % 100 mL IVPB        1 g 200 mL/hr over 30 Minutes Intravenous Every 24 hours 11/01/23 0919 11/06/23 0959   10/29/23 0000  cephALEXin (KEFLEX) 500 MG capsule        500 mg Oral 3 times daily 10/29/23 1835     10/28/23 1915  ceFAZolin (ANCEF) IVPB 2g/100 mL premix        2 g 200 mL/hr over 30 Minutes Intravenous Every 8 hours 10/28/23 1818 10/30/23 1821   10/28/23 0600  ceFAZolin (ANCEF) IVPB 3g/150 mL premix        3 g 300 mL/hr over 30 Minutes Intravenous On call to O.R. 10/28/23 0549 10/28/23 1016       Objective: Vitals:   11/02/23 1120 11/02/23 1235  BP: (!) 144/101   Pulse: (!) 108   Resp: 18   Temp: 99.1 F (37.3 C) 99.5 F (37.5 C)  SpO2: 94%     Intake/Output Summary (Last 24 hours) at 11/02/2023 1349 Last data filed at 11/02/2023 0630 Gross per 24 hour  Intake 823 ml  Output 4150 ml  Net -3327 ml   Filed Weights   10/28/23 0558  Weight: 130.6 kg   Weight change:  Body mass index is 41.32 kg/m.   Physical Exam: General exam: Pleasant, elderly Caucasian male.  In distress from left upper extremity pain Skin: No rashes, lesions or ulcers. HEENT: Atraumatic, normocephalic, no obvious  bleeding Lungs: Clear to auscultation bilaterally,  CVS: S1, S2, no murmur,   GI/Abd: Soft, nontender, nondistended, bowel sound present,   CNS: Alert, awake, oriented x 3 Psychiatry: Mood appropriate Extremities: No pedal edema, no calf tenderness,   Data Review: I have personally reviewed the laboratory data and studies available.  F/u labs ordered Unresulted Labs (From admission, onward)     Start     Ordered   11/03/23 0500  CBC with Differential/Platelet  Tomorrow morning,   R       Question:  Specimen collection method  Answer:  Lab=Lab collect   11/02/23 1349   11/03/23 0500  Basic metabolic panel with GFR  Tomorrow morning,   R       Question:  Specimen collection method  Answer:  Lab=Lab collect   11/02/23 1349            Signed, Chapman Rota, MD [facility] 11/02/2023

## 2023-11-02 NOTE — Progress Notes (Signed)
 Patient ID: Dale Green, male   DOB: [birth year], 68 y.o.   MRN: [id number] Vital signs stable Still with chest wall pain ,increased d-dimer will order cta chest

## 2023-11-02 NOTE — Plan of Care (Signed)

## 2023-11-02 NOTE — Progress Notes (Signed)
 Pt off the unit to CT.

## 2023-11-03 ENCOUNTER — Inpatient Hospital Stay (HOSPITAL_COMMUNITY)

## 2023-11-03 DIAGNOSIS — G959 Disease of spinal cord, unspecified: Secondary | ICD-10-CM | POA: Diagnosis not present

## 2023-11-03 LAB — CBC WITH DIFFERENTIAL/PLATELET
Abs Immature Granulocytes: 0.1 K/uL — ABNORMAL HIGH (ref 0.00–0.07)
Basophils Absolute: 0.1 K/uL (ref 0.0–0.1)
Basophils Relative: 0 %
Eosinophils Absolute: 0.2 K/uL (ref 0.0–0.5)
Eosinophils Relative: 2 %
HCT: 29.2 % — ABNORMAL LOW (ref 39.0–52.0)
Hemoglobin: 9.4 g/dL — ABNORMAL LOW (ref 13.0–17.0)
Immature Granulocytes: 1 %
Lymphocytes Relative: 24 %
Lymphs Abs: 2.7 K/uL (ref 0.7–4.0)
MCH: 28.8 pg (ref 26.0–34.0)
MCHC: 32.2 g/dL (ref 30.0–36.0)
MCV: 89.6 fL (ref 80.0–100.0)
Monocytes Absolute: 1.3 K/uL — ABNORMAL HIGH (ref 0.1–1.0)
Monocytes Relative: 12 %
Neutro Abs: 6.9 K/uL (ref 1.7–7.7)
Neutrophils Relative %: 61 %
Platelets: 212 K/uL (ref 150–400)
RBC: 3.26 MIL/uL — ABNORMAL LOW (ref 4.22–5.81)
RDW: 14.1 % (ref 11.5–15.5)
WBC: 11.2 K/uL — ABNORMAL HIGH (ref 4.0–10.5)
nRBC: 0 % (ref 0.0–0.2)

## 2023-11-03 LAB — BASIC METABOLIC PANEL WITH GFR
Anion gap: 8 (ref 5–15)
BUN: 26 mg/dL — ABNORMAL HIGH (ref 8–23)
CO2: 25 mmol/L (ref 22–32)
Calcium: 8 mg/dL — ABNORMAL LOW (ref 8.9–10.3)
Chloride: 100 mmol/L (ref 98–111)
Creatinine, Ser: 1.89 mg/dL — ABNORMAL HIGH (ref 0.61–1.24)
GFR, Estimated: 38 mL/min — ABNORMAL LOW (ref >60)
Glucose, Bld: 173 mg/dL — ABNORMAL HIGH (ref 70–99)
Potassium: 3.9 mmol/L (ref 3.5–5.1)
Sodium: 133 mmol/L — ABNORMAL LOW (ref 135–145)

## 2023-11-03 MED ORDER — GADOBUTROL 1 MMOL/ML IV SOLN
10.0000 mL | Freq: Once | INTRAVENOUS | Status: AC | PRN
Start: 2023-11-03 — End: 2023-11-03
  Administered 2023-11-03: 10 mL via INTRAVENOUS

## 2023-11-03 NOTE — Progress Notes (Signed)
 Patient ID: Dale Green, male   DOB: 30-Aug-1955, 68 y.o.   MRN: [id number] Patient is awake and alert left shoulder pain still seems to be a major issue.  CTA shows no evidence of a pulmonary embolus however there was a suggestion that he may have a thoracic disc herniation.  MRI of the thoracic spine was ordered and I have reviewed this with the patient he does have extensive osteophytosis in the thoracic spine suggesting multiple levels of fusion.  This would be consistent with a syndrome of disseminated idiopathic skeletal hyperostosis.  He has some foraminal stenosis at T1-2 and T2-T3 but I do not believe that his symptoms correspond to radiculopathy in this region.  The cervical spine appears well decompressed on the CT scan and no hardware issues are noted.  As regards the persistent left shoulder pain I have advised that we will do some plain x-rays of the shoulder and possibly an MRI depending on what the x-rays show.  Foley remains in place at this time urology consultation is helpful.

## 2023-11-03 NOTE — Progress Notes (Signed)
 Physical Therapy Treatment Patient Details Name: Dale Green MRN: [id number] DOB: 07-09-1955 Today's Date: 11/03/2023   History of Present Illness 68 yo male s/p C3-7 laminectomy with posterolateral facet screw fixation from C3-7, posterior cervical fusion C3-7 10/9. PMH includes BPH, CKD, gout, HLD, HTN, OA (per pt has needed L TKA x 10 yrs).    PT Comments  Pt reports improved L shoulder pain compared to yesterday however now experiencing R shoulder pain with ambulation. Pt encouraged to rely more on LEs vs UE dependency on RW. Pt with improved ambulation tolerance and ability to do ramp to mimic home set up. Acute PT to cont to follow.    If plan is discharge home, recommend the following: A little help with walking and/or transfers;A little help with bathing/dressing/bathroom;Assist for transportation;Help with stairs or ramp for entrance   Can travel by private vehicle        Equipment Recommendations  Rolling walker (2 wheels)    Recommendations for Other Services       Precautions / Restrictions Precautions Precautions: Fall;Cervical Precaution Booklet Issued: No Recall of Precautions/Restrictions: Intact Precaution/Restrictions Comments: pt able to verbalize precautions Restrictions Weight Bearing Restrictions Per Provider Order: No     Mobility  Bed Mobility Overal bed mobility: Needs Assistance Bed Mobility: Rolling, Sidelying to Sit Rolling: Supervision Sidelying to sit: Min assist       General bed mobility comments: pt will sleep in recliner at home therefore bed features used to assist pt be more independent, rolls to the R with HOB flat then elevates HOB to aide in trunk elevation    Transfers Overall transfer level: Needs assistance Equipment used: Rolling walker (2 wheels) Transfers: Sit to/from Stand Sit to Stand: Contact guard assist           General transfer comment: Pt able to come to stand without physical assist, pushes up from bed,  increased time    Ambulation/Gait Ambulation/Gait assistance: Contact guard assist Gait Distance (Feet): 250 Feet Assistive device: Rolling walker (2 wheels) Gait Pattern/deviations: Step-through pattern, Decreased stride length, Trunk flexed Gait velocity: decr     General Gait Details: contact guard, frequent stops due to onset of R shld pain this date, attempting to maintain upright posture, no knee buckling, encouraged increased use of LEs vs inc dependence on UEs   Stairs             Wheelchair Mobility     Tilt Bed    Modified Rankin (Stroke Patients Only)       Balance Overall balance assessment: Needs assistance Sitting-balance support: No upper extremity supported, Feet supported Sitting balance-Leahy Scale: Fair     Standing balance support: Bilateral upper extremity supported, During functional activity Standing balance-Leahy Scale: Poor Standing balance comment: reliant on external support post-op                            Communication Communication Communication: No apparent difficulties  Cognition Arousal: Alert Behavior During Therapy: WFL for tasks assessed/performed   PT - Cognitive impairments: No apparent impairments                         Following commands: Intact      Cueing Cueing Techniques: Verbal cues  Exercises      General Comments General comments (skin integrity, edema, etc.): VSS      Pertinent Vitals/Pain Pain Assessment Pain Assessment: 0-10 Pain Score: 2  Pain Location: L shoulder Pain Descriptors / Indicators:  (twitching) Pain Intervention(s): Monitored during session    Home Living                          Prior Function            PT Goals (current goals can now be found in the care plan section) Acute Rehab PT Goals Patient Stated Goal: stop L shoulder pain/twitching PT Goal Formulation: With patient/family Time For Goal Achievement: 11/13/23 Potential to Achieve  Goals: Good Progress towards PT goals: Progressing toward goals    Frequency    Min 3X/week      PT Plan      Co-evaluation              AM-PAC PT 6 Clicks Mobility   Outcome Measure  Help needed turning from your back to your side while in a flat bed without using bedrails?: A Little Help needed moving from lying on your back to sitting on the side of a flat bed without using bedrails?: A Little Help needed moving to and from a bed to a chair (including a wheelchair)?: A Little Help needed standing up from a chair using your arms (e.g., wheelchair or bedside chair)?: A Little Help needed to walk in hospital room?: A Little Help needed climbing 3-5 steps with a railing? : A Little 6 Click Score: 18    End of Session Equipment Utilized During Treatment: Gait belt Activity Tolerance: Patient tolerated treatment well Patient left: with call bell/phone within reach;with family/visitor present;in chair Nurse Communication: Mobility status;Patient requests pain meds PT Visit Diagnosis: Other abnormalities of gait and mobility (R26.89);Muscle weakness (generalized) (M62.81)     Time: 9255-9187 PT Time Calculation (min) (ACUTE ONLY): 28 min  Charges:    $Gait Training: 23-37 mins PT General Charges $$ ACUTE PT VISIT: 1 Visit                     Norene Ames, PT, DPT Acute Rehabilitation Services Secure chat preferred Office #: [phone]    Norene CHRISTELLA Ames 11/03/2023, 9:22 AM

## 2023-11-03 NOTE — Progress Notes (Signed)
 6 Days Post-Op Subjective: Pt in up in . chair feeling better today. Still struggling with pain and myoclonic type jerking of left arm. Foley draining clear yellow urine.   Objective: Vital signs in last 24 hours: Temp:  [98.4 F (36.9 C)-99.5 F (37.5 C)] 98.8 F (37.1 C) (10/15 1540) Pulse Rate:  [71-88] 88 (10/15 1540) Resp:  [18-20] 20 (10/15 1540) BP: (121-150)/(49-69) 123/56 (10/15 1540) SpO2:  [91 %-95 %] 95 % (10/15 1540)  Assessment/Plan: #Urethral stricture #foley trauma # urethral meatus pain   S/p cystourethroscopy with serial dilation of urethral stricture.  Foley catheter in place.  7 days total for proper epithelialization and stretch injury of bladder.    We will be within our window to proceed with voiding trial at any point going forward. Primary goals are ambulation and regular bowel movements. Pt is still working through constipation. Shared decision to keep foley until he has a bowel movement. Will hopefully remove prior to discharge. Continuing daily laxatives.    Pt has remained afebrile and leukocytosis has nearly resolved.    Bacitracin ointment to urethral meatus TID. Pt reports resolution of stinging.   Continue Flomax.    Urology will follow peripherally. Please call with questions.    Intake/Output from previous day: 10/14 0701 - 10/15 0700 In: 600 [P.O.:600] Out: 2050 [Urine:2050]  Intake/Output this shift: No intake/output data recorded.  Physical Exam:  General: Alert and oriented CV: No cyanosis Lungs: equal chest rise Abdomen: Soft, NTND, no rebound or guarding Gu: foley catheter in place draining clear yellow urine.   Lab Results: Recent Labs    11/01/23 1212 11/03/23 0233  HGB 10.0* 9.4*  HCT 31.0* 29.2*   BMET Recent Labs    11/01/23 1212 11/03/23 0233  NA 136 133*  K 4.1 3.9  CL 100 100  CO2 27 25  GLUCOSE 125* 173*  BUN 27* 26*  CREATININE 1.87* 1.89*  CALCIUM 8.4* 8.0*  HGB 10.0* 9.4*  WBC 14.6* 11.2*      Studies/Results: DG Shoulder Left Result Date: 11/03/2023 CLINICAL DATA:  Shoulder pain. EXAM: LEFT SHOULDER - 2+ VIEW COMPARISON:  None Available. FINDINGS: There is no evidence of fracture or dislocation. Mild osteoarthritis of the glenohumeral and acromioclavicular joints. Soft tissues are unremarkable. IMPRESSION: 1. No acute osseous abnormality. 2. Mild osteoarthritis of the glenohumeral and acromioclavicular joints. Electronically Signed   By: Harrietta Sherry M.D.   On: 11/03/2023 17:17   MR THORACIC SPINE WO CONTRAST Result Date: 11/03/2023 EXAM: MRI THORACIC SPINE WITHOUT INTRAVENOUS CONTRAST 11/03/2023 06:22:00 AM TECHNIQUE: Multiplanar multisequence MRI of the thoracic spine was performed without the administration of intravenous contrast. COMPARISON: Postoperative cervical spine CT 11/02/2023. CTA chest 11/02/2023. Preoperative cervical MRI 05/21/2023. CLINICAL HISTORY: 69 year old male with acute thoracic myelopathy, left shoulder/back/chest pain, and recent cervical fusion. FINDINGS: BONES AND ALIGNMENT: Partially visible cervical spine with extensive ankylosis (C2 through C7 based on the recent CT) and posterior decompression postoperative changes. Thoracic spine segmentation appears to be normal as on the CTA yesterday. Generalized thoracic hyperostosis and interbody ankylosis demonstrated on the CT yesterday T2 through T12 levels. Normal alignment. Normal vertebral body heights. Bone marrow signal is unremarkable. No marrow edema. No abnormal enhancement. Visible spinal canal patency appears improved compared to preoperative MRI. Stable thoracic kyphosis. Absent ankylosis with bulky anterior endplate spurring and vacuum phenomenon at T1-T2. Maintained thoracic vertebral height. SPINAL CORD: Some heterogeneous lower cervical spinal cord signal suggesting myelomalacia at C6 (series 20 images 3 and  5). Thoracic spinal cord volume relatively maintained. No thoracic spinal cord signal  abnormality identified. Minimal degenerative thoracic spinal cord mass effect (see DEGENERATIVE CHANGES). Conus medullaris occurs below T12 and is not included. SOFT TISSUES: Partially visible confluent postoperative soft tissue changes posterior to the cervical levels. Below the postoperative changes at the cervicothoracic junction the thoracic paraspinal soft tissues are within normal limits. DEGENERATIVE CHANGES: Diffuse thoracic ankylosis below T2 demonstrated on CTA yesterday. Upper thoracic degenerative neural foraminal stenosis related to bulky endplate spurring at the bilateral T1 and T2 nerve levels, moderate to severe. Isolated mild thoracic spinal stenosis at T7-T8 related to left paracentral disc osteophyte complex. Degenerative lower thoracic osseous neural foraminal stenosis at T10-T11, mild to moderate. IMPRESSION: 1. Partially visible postoperative cervical decompression. Suspected cervical spinal cord myelomalacia at C6. 2. Thoracic ankylosis below T2. No acute osseous abnormality. 3. Isolated mild thoracic spinal stenosis at T7-T8 with minimal thoracic cord mass effect and no thoracic spinal cord signal abnormality. Moderate to severe bilateral neural foraminal stenosis at the T1 and T2 nerve levels. Electronically signed by: Helayne Hurst MD 11/03/2023 07:52 AM EDT RP Workstation: [id number]   CT CERVICAL SPINE WO CONTRAST Result Date: 11/02/2023 EXAM: CT CERVICAL SPINE WITHOUT CONTRAST 11/02/2023 04:48:47 PM TECHNIQUE: CT of the cervical spine was performed without the administration of intravenous contrast. Multiplanar reformatted images are provided for review. Automated exposure control, iterative reconstruction, and/or weight based adjustment of the mA/kV was utilized to reduce the radiation dose to as low as reasonably achievable. COMPARISON: CT cervical spine 08/19/2023 CLINICAL HISTORY: Postop. Left chest wall pain. FINDINGS: CERVICAL SPINE: BONES AND ALIGNMENT: Cervical spine  straightening. No listhesis. No acute fracture or suspicious lesion. Solid bridging anterior vertebral osteophytes from C2 to T1 as previously described. Interval posterior decompression and fusion from C3 to C7. Articular pillar screws and interconnecting rods are in place bilaterally without evidence of hardware failure or loosening. DEGENERATIVE CHANGES: Severe right neural foraminal stenosis at C6-C7 due to bulky uncovertebral spurring. Up to mild residual osseous neural foraminal stenosis elsewhere. SOFT TISSUES: Postoperative changes in the posterior neck soft tissues including a small amount of scattered gas. No gross spinal canal hematoma, although assessment is limited by streak artifact. No prevertebral soft tissue swelling. IMPRESSION: 1. Interval posterior decompression and fusion from C3 to C7 without evidence of hardware complication or acute fracture. 2. Severe right neural foraminal stenosis at C6-C7 due to uncovertebral spurring. Electronically signed by: Dasie Hamburg MD 11/02/2023 05:33 PM EDT RP Workstation: [id number]   CT Angio Chest Pulmonary Embolism (PE) W or WO Contrast Result Date: 11/02/2023 EXAM: CTA of the Chest with contrast for PE 11/02/2023 04:48:47 PM TECHNIQUE: CTA of the chest was performed without and with the administration of 75 mL of iohexol  (OMNIPAQUE ) 350 MG/ML injection. Multiplanar reformatted images are provided for review. MIP images are provided for review. Automated exposure control, iterative reconstruction, and/or weight based adjustment of the mA/kV was utilized to reduce the radiation dose to as low as reasonably achievable. COMPARISON: CT abdomen and pelvis dated 01/08/2023. CLINICAL HISTORY: PE suspected, chest pain, increased D-dimer. Postoperative left chest wall pain. FINDINGS: PULMONARY ARTERIES: Pulmonary arteries are adequately opacified for evaluation. No pulmonary embolism. Main pulmonary artery is normal in caliber. MEDIASTINUM: The heart and  pericardium demonstrate no acute abnormality. There is mild calcified atherosclerotic disease of the aorta. LYMPH NODES: No mediastinal, hilar or axillary lymphadenopathy. LUNGS AND PLEURA: The lungs are without acute process. No focal consolidation or pulmonary edema. No pleural effusion  or pneumothorax. UPPER ABDOMEN: Limited images of the upper abdomen are unremarkable. SOFT TISSUES AND BONES: There is disc herniation of the thoracic spine. No acute bone or soft tissue abnormality. IMPRESSION: 1. No pulmonary embolism 2. Mild calcified atherosclerotic disease of the aorta Electronically signed by: Greig Pique MD 11/02/2023 05:08 PM EDT RP Workstation: [id number]      LOS: 6 days   Ole Bourdon, NP [facility] Pager: [phone]  11/03/2023, 7:16 PM

## 2023-11-03 NOTE — Progress Notes (Signed)
 PROGRESS NOTE  Dale Green  DOB: Nov 22, 1955  PCP: Bertell Satterfield, MD FMW:[id number]  DOA: 10/28/2023  LOS: 6 days  Hospital Day: 7  Subjective: Patient was seen and examined this morning.  Lying down in bed.  Not in distress.  Still complains of left shoulder pain. Afebrile, hemodynamically stable, breathing room air Labs this morning with sodium 133, creatinine 1.89, WC count better at 11.2, hemoglobin 9.4  Brief narrative: Dale Green is a 68 y.o. male with PMH significant for HTN, HLD, CKD, BPH, osteoarthritis. Over the last several weeks, patient had progressive neck, shoulder and arm discomfort.  He was seen by neurosurgery as an outpatient.   MRI of cervical spine in May 2025 showed advanced spondylitic stenosis at multiple levels spanning from C3-C7. CT scan in July 2025 showed severely restricted ligament and posterior spine.  10/9, he underwent elective C3-7 PCDF on 10/9 by neurosurgery Dr. Colon. While in the OR, patient was also seen by urology for difficult catheter placement. Patient was admitted under neurosurgery postoperatively. Postoperatively seen by PT for impaired mobility  10/13, patient complained of left-sided chest pain, dyspnea on exertion.  Hospitalist service was consulted for the concern of pulm embolism.  10/14, CT angio chest rule out pulm embolism 10/14, CT cervical spine showed postsurgical status without complication.  Also showed severe right neuroforaminal stenosis at C6-C7 due to uncovertebral spurring  10/15, MRI thoracic spine showed 1. Partially visible postoperative cervical decompression. Suspected cervical spinal cord myelomalacia at C6. 2. Thoracic ankylosis below T2. No acute osseous abnormality. 3. Isolated mild thoracic spinal stenosis at T7-T8 with minimal thoracic cord mass effect and no thoracic spinal cord signal abnormality. Moderate to severe bilateral neural foraminal stenosis at the T1 and T2 nerve levels.  Assessment  and plan: L shoulder pain Elevated D-dimer On exam this morning 10/14, patient complains of persistent left  arm, left lateral chest wall pain which is worse on any movement of the left shoulder. CT angio chest rule out pulm embolism. Underwent CT cervical spine and MRI thoracic spine as well.  Report as above.  Neurosurgery to interpret.  Unclear if these findings are the cause of his left sided chest pain and left shoulder pain Pain regimen --- Scheduled: Neurontin 300 mg 3 times daily, Robaxin 500 mg 3 times daily, Tylenol  1 g 3 times daily --- PRN: IV Dilaudid 1 mg every 2 hours, oxycodone 5 mg every 6 hours  Cervical myelopathy Imagings as above. S/p C3-7 PCDF on 10/9 by neurosurgery Dr. Colon. PT following   Acute urinary retention H/o BPH While in the OR, patient was also seen by urology for difficult catheter placement. Noted plan from urology to continue catheter at discharge and follow-up at [facility] urology for voiding trial. Continue Flomax  Leukocytosis WBC count was elevated 25K on 10/10. Gradually improved.  Noted antibiotics switched to IV Rocephin  by urology on 10/13. WBC count improving.  11.2 today. Recent Labs  Lab 10/29/23 0509 11/01/23 1212 11/03/23 0233  WBC 25.4* 14.6* 11.2*   AKI on CKD 3b Baseline creatinine 1.75 from 10/2.  Postoperatively, creatinine worsened to 2.16. Has improved with IV fluid.  Continue to monitor Recent Labs    10/21/23 0930 10/29/23 0509 11/01/23 1212 11/03/23 0233  BUN 16 28* 27* 26*  CREATININE 1.75* 2.16* 1.87* 1.89*  CO2 26 25 27 25    Hypertension Not on home meds.  Blood pressure currently controlled in normal range Continue to monitor.  IV hydralazine as needed  HLD Continue Crestor  Gout Continue allopurinol  300 mg daily  Impaired mobility mostly because of inability to use her walker due to left upper extremity pain.  Neurosurgery is the primary team.  Will sign off at this time.    Mobility:  PT  Orders: Active   PT Follow up Rec: Follow Physician's Recommendations For Discharge Plan And Follow Up Therapies10/15/2025 0900   Goals of care   Code Status: Full Code     DVT prophylaxis:  SCD's Start: 10/28/23 1819   Antimicrobials: IV Rocephin  Fluid: None Family Communication: None at bedside  Status: Patient Level of care:  Med-Surg   Patient is from: Home Needs to continue in-hospital care: Ongoing workup Anticipated d/c to: Pending clinical course   Diet:  Diet Order             Diet - low sodium heart healthy           Diet regular Room service appropriate? Yes with Assist; Fluid consistency: Thin  Diet effective now                   Scheduled Meds:  acetaminophen   1,000 mg Oral TID   allopurinol   300 mg Oral q AM   bacitracin   Topical TID   Chlorhexidine Gluconate Cloth  6 each Topical Daily   docusate sodium  100 mg Oral BID   gabapentin  300 mg Oral TID   methocarbamol  500 mg Oral TID   rosuvastatin  20 mg Oral q AM   senna  1 tablet Oral BID   sodium chloride  flush  3 mL Intravenous Q12H   tamsulosin  0.4 mg Oral q AM    PRN meds: bisacodyl, bisacodyl, HYDROmorphone (DILAUDID) injection, menthol **OR** phenol, ondansetron  **OR** ondansetron  (ZOFRAN ) IV, oxyCODONE, polyethylene glycol, sodium chloride  flush, sodium phosphate   Infusions:   cefTRIAXone  (ROCEPHIN )  IV Stopped (11/03/23 1023)    Antimicrobials: Anti-infectives (From admission, onward)    Start     Dose/Rate Route Frequency Ordered Stop   11/01/23 1000  cefTRIAXone  (ROCEPHIN ) 1 g in sodium chloride  0.9 % 100 mL IVPB        1 g 200 mL/hr over 30 Minutes Intravenous Every 24 hours 11/01/23 0919 11/06/23 0959   10/29/23 0000  cephALEXin (KEFLEX) 500 MG capsule        500 mg Oral 3 times daily 10/29/23 1835     10/28/23 1915  ceFAZolin (ANCEF) IVPB 2g/100 mL premix        2 g 200 mL/hr over 30 Minutes Intravenous Every 8 hours 10/28/23 1818 10/30/23 1821   10/28/23 0600   ceFAZolin (ANCEF) IVPB 3g/150 mL premix        3 g 300 mL/hr over 30 Minutes Intravenous On call to O.R. 10/28/23 0549 10/28/23 1016       Objective: Vitals:   11/03/23 0748 11/03/23 1159  BP: 121/63 (!) 150/69  Pulse: 71 84  Resp: 18 18  Temp: 98.4 F (36.9 C) 99.5 F (37.5 C)  SpO2: 92% 95%    Intake/Output Summary (Last 24 hours) at 11/03/2023 1324 Last data filed at 11/03/2023 1230 Gross per 24 hour  Intake 1040 ml  Output 2850 ml  Net -1810 ml   Filed Weights   10/28/23 0558  Weight: 130.6 kg   Weight change:  Body mass index is 41.32 kg/m.   Physical Exam: General exam: Pleasant, elderly Caucasian male.  Continues to have left shoulder pain Skin: No rashes, lesions or ulcers. HEENT:  Atraumatic, normocephalic, no obvious bleeding Lungs: Clear to auscultation bilaterally,  CVS: S1, S2, no murmur,   GI/Abd: Soft, nontender, nondistended, bowel sound present,   CNS: Alert, awake, oriented x 3 Psychiatry: Mood appropriate Extremities: No pedal edema, no calf tenderness,   Data Review: I have personally reviewed the laboratory data and studies available.  F/u labs ordered Unresulted Labs (From admission, onward)    None       Signed, Chapman Rota, MD [facility] 11/03/2023

## 2023-11-04 NOTE — Discharge Summary (Signed)
 Physician Discharge Summary  Patient ID: Dale Green MRN: [id number] DOB/AGE: Apr 20, 1955 68 y.o.  Admit date: 10/28/2023 Discharge date: 11/05/2023  Admission Diagnoses: Cervical spondylosis with myelopathy  Discharge Diagnoses: Cervical spondylosis with myelopathy.  Urethral stricture. Principal Problem:   Cervical myelopathy [facility])   Discharged Condition: good  Hospital Course: Patient was admitted to undergo surgical decompression of the cervical spine via posterior approach.  He has multilevel stenosis starting from C3 down to C7.  At the time of surgery placing a Foley catheter was not immediately possible and urology consultation was obtained.  This required several passes with the scope to ultimately be able to pass the catheter there was significant urethral trauma and the patient had noted stricture.  Patient underwent surgical intervention and postoperatively had a drain in place for 24 hours.  The drain was removed incision was clean and dry and it was felt he was ready for discharge however he complained of significant discomfort first from the Foley but also from pain in the region of the left shoulder.  He also became febrile on the second postoperative day.  He was started on IV antibiotics of Ancef and this gradually seemed to resolve.  Because of the pain in the left upper chest wall and shoulder region there was concern that he may have pulmonary emboli scans were performed which ruled this out.  Ultimately underwent an MRI of the shoulder on the left side and this revealed moderate arthritic changes in that shoulder.  Patient gradually was mobilized the Foley catheter was removed on the seventh hospital day.  He was able to void and is discharged home on oral medications.  Orthopedic follow-up for shoulder discomfort will be obtained.  Consults: urology  Significant Diagnostic Studies: None  Treatments: surgery: See op note  Discharge Exam: Blood pressure (!) 155/62,  pulse 80, temperature 98.4 F (36.9 C), temperature source Oral, resp. rate 18, height 5' 10 (1.778 m), weight 130.6 kg, SpO2 96%. Incision is clean and dry Station and gait are intact.  Disposition: Discharge disposition: 01-Home or Self Care       Discharge Instructions     Call MD for:  redness, tenderness, or signs of infection (pain, swelling, redness, odor or green/yellow discharge around incision site)   Complete by: As directed    Call MD for:  redness, tenderness, or signs of infection (pain, swelling, redness, odor or green/yellow discharge around incision site)   Complete by: As directed    Call MD for:  severe uncontrolled pain   Complete by: As directed    Call MD for:  severe uncontrolled pain   Complete by: As directed    Call MD for:  temperature >100.4   Complete by: As directed    Call MD for:  temperature >100.4   Complete by: As directed    Diet - low sodium heart healthy   Complete by: As directed    Diet - low sodium heart healthy   Complete by: As directed    Discharge wound care:   Complete by: As directed    Remove dressing before showering.  After shower pat dry paint incision with Betadine and cover with light gauze.  Do this for the next 6 days.  Thereafter can leave wound open if it is remaining dry   Discharge wound care:   Complete by: As directed    Okay to shower.  Paint incision with Betadine after shower and cover with light gauze dressing until completely dry.  Incentive spirometry RT   Complete by: As directed    Increase activity slowly   Complete by: As directed    Increase activity slowly   Complete by: As directed       Allergies as of 11/04/2023   No Known Allergies      Medication List     TAKE these medications    allopurinol  300 MG tablet Commonly known as: ZYLOPRIM  Take 300 mg by mouth in the morning.   cephALEXin 500 MG capsule Commonly known as: KEFLEX Take 1 capsule (500 mg total) by mouth 3 (three) times  daily.   FLAXSEED OIL PO Take 1,000 mg by mouth in the morning.   methocarbamol 500 MG tablet Commonly known as: ROBAXIN Take 1 tablet (500 mg total) by mouth every 6 (six) hours as needed for muscle spasms.   MSM PO Take 1,000 mg by mouth in the morning.   oxyCODONE-acetaminophen  5-325 MG tablet Commonly known as: PERCOCET/ROXICET Take 1-2 tablets by mouth every 6 (six) hours as needed for moderate pain (pain score 4-6) or severe pain (pain score 7-10).   penicillin v potassium 500 MG tablet Commonly known as: VEETID Take 500 mg by mouth 4 (four) times daily.   rosuvastatin 20 MG tablet Commonly known as: CRESTOR Take 20 mg by mouth in the morning.   tamsulosin 0.4 MG Caps capsule Commonly known as: FLOMAX Take 0.4 mg by mouth in the morning.   VITAMIN D3 PO Take 5,000 Units by mouth in the morning.               Discharge Care Instructions  (From admission, onward)           Start     Ordered   11/04/23 0000  Discharge wound care:       Comments: Okay to shower.  Paint incision with Betadine after shower and cover with light gauze dressing until completely dry.   11/04/23 1436   10/29/23 0000  Discharge wound care:       Comments: Remove dressing before showering.  After shower pat dry paint incision with Betadine and cover with light gauze.  Do this for the next 6 days.  Thereafter can leave wound open if it is remaining dry   10/29/23 1835             Signed: Victory JINNY Gens 11/04/2023, 2:36 PM

## 2023-11-04 NOTE — TOC Transition Note (Addendum)
 Transition of Care [facility]) - Discharge Note Rayfield Gobble RN,BSN Inpatient Care Management Unit 4NP (Non Trauma)- RN Case Manager See Treatment Team for direct Phone #   Patient Details  Name: Dale Green MRN: [id number] Date of Birth: Oct 23, 1955  [facility] CM/SW Contact:  Gobble Rayfield Hurst, RN Phone Number: 11/04/2023, 3:34 PM   Clinical Narrative:    Noted d/c order placed with d/c plan for 10/17.   CM in to speak with pt regarding DME needs. Per therapy recs for RW.   Per discussion with pt he has rollator at home, and voiced he does not feel he will need RW as well for discharge. Pt declines DME RW at this time.   Wife to transport home in the am.  Discussed therapy needs- pt will f/u with surgeon on post op visit as well as check with his orthopedic MD regarding therapy needs.  Pt voiced his PCP retired recently and he plans to establish with [facility] in [place]  No further IP CM needs noted at this time.    Final next level of care: Home/Self Care Barriers to Discharge: No Barriers Identified   Patient Goals and CMS Choice Patient states their goals for this hospitalization and ongoing recovery are:: return home   Choice offered to / list presented to : Patient      Discharge Placement               home        Discharge Plan and Services Additional resources added to the After Visit Summary for     Discharge Planning Services: CM Consult Post Acute Care Choice: Durable Medical Equipment, Home Health          DME Arranged: Patient refused services DME Agency: NA       HH Arranged: NA HH Agency: NA        Social Drivers of Health (SDOH) Interventions SDOH Screenings   Food Insecurity: Patient Unable To Answer (10/28/2023)  Housing: Patient Unable To Answer (10/28/2023)  Transportation Needs: Patient Unable To Answer (10/28/2023)  Utilities: Patient Unable To Answer (10/28/2023)  Social Connections: Patient Unable To Answer  (10/28/2023)  Tobacco Use: Medium Risk (10/28/2023)     Readmission Risk Interventions    11/04/2023    3:34 PM  Readmission Risk Prevention Plan  Transportation Screening Complete  Home Care Screening Complete  Medication Review (RN CM) Complete

## 2023-11-04 NOTE — Progress Notes (Signed)
 7 Days Post-Op Subjective: No acute events overnight.  Patient has had 2 bowel movements and has been ambulating regularly.  Foley catheter removed.  Objective: Vital signs in last 24 hours: Temp:  [97.8 F (36.6 C)-98.8 F (37.1 C)] 98.4 F (36.9 C) (10/16 1131) Pulse Rate:  [66-88] 80 (10/16 1131) Resp:  [16-20] 18 (10/16 1131) BP: (123-155)/(56-68) 155/62 (10/16 1131) SpO2:  [93 %-100 %] 96 % (10/16 1131)  Assessment/Plan: #Urethral stricture #foley trauma # urethral meatus pain   S/p cystourethroscopy with serial dilation of urethral stricture.  Foley catheter in place.  7 days total for proper epithelialization and recovering from stretch injury of bladder.    Voiding trial this morning.  Patient cleared to discharge from urologic perspective once he has voided   Continue Flomax.      Intake/Output from previous day: 10/15 0701 - 10/16 0700 In: 1443 [P.O.:1240; I.V.:3; IV Piggyback:200] Out: 2250 [Urine:2250]  Intake/Output this shift: Total I/O In: -  Out: 650 [Urine:650]  Physical Exam:  General: Alert and oriented CV: No cyanosis Lungs: equal chest rise Abdomen: Soft, NTND, no rebound or guarding Gu: foley catheter in place draining clear yellow urine.   Lab Results: Recent Labs    11/03/23 0233  HGB 9.4*  HCT 29.2*   BMET Recent Labs    11/03/23 0233  NA 133*  K 3.9  CL 100  CO2 25  GLUCOSE 173*  BUN 26*  CREATININE 1.89*  CALCIUM 8.0*  HGB 9.4*  WBC 11.2*     Studies/Results: MR SHOULDER LEFT W WO CONTRAST Result Date: 11/03/2023 CLINICAL DATA:  Chronic shoulder pain. EXAM: MRI OF THE LEFT SHOULDER WITHOUT AND WITH CONTRAST TECHNIQUE: Multiplanar, multisequence MR imaging of the left shoulder was performed before and after the administration of intravenous contrast. CONTRAST:  10mL GADAVIST  GADOBUTROL  1 MMOL/ML IV SOLN COMPARISON:  Left shoulder radiograph dated 11/03/2023. FINDINGS: Rotator cuff: Supraspinatus and infraspinatus  tendinosis with low-grade partial-thickness articular sided insertional tear of the mid to posterior supraspinatus tendon. Suspected low-grade insertional tear of the superior subscapularis tendon. Teres minor tendon is intact. Muscles: Moderate fatty atrophy of the infraspinatus muscle. Mild fatty atrophy of the subscapularis muscle. No significant intramuscular edema. No intramuscular fluid collection. Biceps Long Head: The intra-articular biceps tendon is not visualized and may be torn and retracted. Acromioclavicular Joint: Moderate arthropathy of the acromioclavicular joint with joint space narrowing, marginal osteophytosis, and capsular hypertrophy. Type II acromion. Subacromial spurring. No subacromial/subdeltoid bursal fluid. Glenohumeral Joint: No joint effusion. Mild chondral thinning. Labrum: Grossly intact, but evaluation is limited by lack of intraarticular fluid/contrast. Bones: No fracture or dislocation. No aggressive osseous lesion. Other: No fluid collection. IMPRESSION: 1. Supraspinatus and infraspinatus tendinosis with low-grade partial-thickness articular sided insertional tear of the mid to posterior supraspinatus tendon. 2. Suspected low-grade insertional tear of the superior subscapularis tendon. 3. Moderate fatty atrophy of the infraspinatus muscle. Mild fatty atrophy of the subscapularis muscle. No significant intramuscular edema. 4. The intra-articular biceps tendon is not visualized and may be torn and retracted. 5. Moderate acromioclavicular joint osteoarthritis. 6. Mild glenohumeral joint osteoarthritis. Electronically Signed   By: Harrietta Sherry M.D.   On: 11/03/2023 21:05   DG Shoulder Left Result Date: 11/03/2023 CLINICAL DATA:  Shoulder pain. EXAM: LEFT SHOULDER - 2+ VIEW COMPARISON:  None Available. FINDINGS: There is no evidence of fracture or dislocation. Mild osteoarthritis of the glenohumeral and acromioclavicular joints. Soft tissues are unremarkable. IMPRESSION: 1. No  acute osseous abnormality. 2. Mild osteoarthritis  of the glenohumeral and acromioclavicular joints. Electronically Signed   By: Harrietta Sherry M.D.   On: 11/03/2023 17:17   MR THORACIC SPINE WO CONTRAST Result Date: 11/03/2023 EXAM: MRI THORACIC SPINE WITHOUT INTRAVENOUS CONTRAST 11/03/2023 06:22:00 AM TECHNIQUE: Multiplanar multisequence MRI of the thoracic spine was performed without the administration of intravenous contrast. COMPARISON: Postoperative cervical spine CT 11/02/2023. CTA chest 11/02/2023. Preoperative cervical MRI 05/21/2023. CLINICAL HISTORY: 68 year old male with acute thoracic myelopathy, left shoulder/back/chest pain, and recent cervical fusion. FINDINGS: BONES AND ALIGNMENT: Partially visible cervical spine with extensive ankylosis (C2 through C7 based on the recent CT) and posterior decompression postoperative changes. Thoracic spine segmentation appears to be normal as on the CTA yesterday. Generalized thoracic hyperostosis and interbody ankylosis demonstrated on the CT yesterday T2 through T12 levels. Normal alignment. Normal vertebral body heights. Bone marrow signal is unremarkable. No marrow edema. No abnormal enhancement. Visible spinal canal patency appears improved compared to preoperative MRI. Stable thoracic kyphosis. Absent ankylosis with bulky anterior endplate spurring and vacuum phenomenon at T1-T2. Maintained thoracic vertebral height. SPINAL CORD: Some heterogeneous lower cervical spinal cord signal suggesting myelomalacia at C6 (series 20 images 3 and 5). Thoracic spinal cord volume relatively maintained. No thoracic spinal cord signal abnormality identified. Minimal degenerative thoracic spinal cord mass effect (see DEGENERATIVE CHANGES). Conus medullaris occurs below T12 and is not included. SOFT TISSUES: Partially visible confluent postoperative soft tissue changes posterior to the cervical levels. Below the postoperative changes at the cervicothoracic junction the  thoracic paraspinal soft tissues are within normal limits. DEGENERATIVE CHANGES: Diffuse thoracic ankylosis below T2 demonstrated on CTA yesterday. Upper thoracic degenerative neural foraminal stenosis related to bulky endplate spurring at the bilateral T1 and T2 nerve levels, moderate to severe. Isolated mild thoracic spinal stenosis at T7-T8 related to left paracentral disc osteophyte complex. Degenerative lower thoracic osseous neural foraminal stenosis at T10-T11, mild to moderate. IMPRESSION: 1. Partially visible postoperative cervical decompression. Suspected cervical spinal cord myelomalacia at C6. 2. Thoracic ankylosis below T2. No acute osseous abnormality. 3. Isolated mild thoracic spinal stenosis at T7-T8 with minimal thoracic cord mass effect and no thoracic spinal cord signal abnormality. Moderate to severe bilateral neural foraminal stenosis at the T1 and T2 nerve levels. Electronically signed by: Helayne Hurst MD 11/03/2023 07:52 AM EDT RP Workstation: [id number]   CT CERVICAL SPINE WO CONTRAST Result Date: 11/02/2023 EXAM: CT CERVICAL SPINE WITHOUT CONTRAST 11/02/2023 04:48:47 PM TECHNIQUE: CT of the cervical spine was performed without the administration of intravenous contrast. Multiplanar reformatted images are provided for review. Automated exposure control, iterative reconstruction, and/or weight based adjustment of the mA/kV was utilized to reduce the radiation dose to as low as reasonably achievable. COMPARISON: CT cervical spine 08/19/2023 CLINICAL HISTORY: Postop. Left chest wall pain. FINDINGS: CERVICAL SPINE: BONES AND ALIGNMENT: Cervical spine straightening. No listhesis. No acute fracture or suspicious lesion. Solid bridging anterior vertebral osteophytes from C2 to T1 as previously described. Interval posterior decompression and fusion from C3 to C7. Articular pillar screws and interconnecting rods are in place bilaterally without evidence of hardware failure or loosening.  DEGENERATIVE CHANGES: Severe right neural foraminal stenosis at C6-C7 due to bulky uncovertebral spurring. Up to mild residual osseous neural foraminal stenosis elsewhere. SOFT TISSUES: Postoperative changes in the posterior neck soft tissues including a small amount of scattered gas. No gross spinal canal hematoma, although assessment is limited by streak artifact. No prevertebral soft tissue swelling. IMPRESSION: 1. Interval posterior decompression and fusion from C3 to C7 without evidence of  hardware complication or acute fracture. 2. Severe right neural foraminal stenosis at C6-C7 due to uncovertebral spurring. Electronically signed by: Dasie Hamburg MD 11/02/2023 05:33 PM EDT RP Workstation: [id number]   CT Angio Chest Pulmonary Embolism (PE) W or WO Contrast Result Date: 11/02/2023 EXAM: CTA of the Chest with contrast for PE 11/02/2023 04:48:47 PM TECHNIQUE: CTA of the chest was performed without and with the administration of 75 mL of iohexol  (OMNIPAQUE ) 350 MG/ML injection. Multiplanar reformatted images are provided for review. MIP images are provided for review. Automated exposure control, iterative reconstruction, and/or weight based adjustment of the mA/kV was utilized to reduce the radiation dose to as low as reasonably achievable. COMPARISON: CT abdomen and pelvis dated 01/08/2023. CLINICAL HISTORY: PE suspected, chest pain, increased D-dimer. Postoperative left chest wall pain. FINDINGS: PULMONARY ARTERIES: Pulmonary arteries are adequately opacified for evaluation. No pulmonary embolism. Main pulmonary artery is normal in caliber. MEDIASTINUM: The heart and pericardium demonstrate no acute abnormality. There is mild calcified atherosclerotic disease of the aorta. LYMPH NODES: No mediastinal, hilar or axillary lymphadenopathy. LUNGS AND PLEURA: The lungs are without acute process. No focal consolidation or pulmonary edema. No pleural effusion or pneumothorax. UPPER ABDOMEN: Limited images of the  upper abdomen are unremarkable. SOFT TISSUES AND BONES: There is disc herniation of the thoracic spine. No acute bone or soft tissue abnormality. IMPRESSION: 1. No pulmonary embolism 2. Mild calcified atherosclerotic disease of the aorta Electronically signed by: Greig Pique MD 11/02/2023 05:08 PM EDT RP Workstation: [id number]      LOS: 7 days   Ole Bourdon, NP [facility] Pager: [phone]  11/04/2023, 12:58 PM

## 2023-11-04 NOTE — Plan of Care (Signed)

## 2023-11-04 NOTE — Progress Notes (Signed)
 Patient ID: Dale Green, male   DOB: [birth year], 68 y.o.   MRN: [id number] Vital signs are stable shoulders pain still bothers though only with certain motions.  MRI reviewed and demonstrates that the patient has a significant shoulder arthropathy in addition to possibly a partial tear in the rotator cuff.  He will ultimately need some orthopedic follow-up for this process.  His incision is dry and healing continues with dressing changes.  Foley catheter just removed.  Plan for discharge tomorrow.

## 2023-11-04 NOTE — Progress Notes (Signed)
 Pt requested IV to be removed because it was bothering them. IV removed, pt to be d/c 11/05/23 morning.

## 2023-11-05 ENCOUNTER — Telehealth: Payer: Self-pay | Admitting: Urology

## 2023-11-05 NOTE — Telephone Encounter (Signed)
 Patient left message needs post op appt. 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

## 2023-11-05 NOTE — Telephone Encounter (Signed)
 Returned call to pt to advise him MD wrenn no longer works at [employer] and he would need to f/u w/ him at [facility] for his post op needs

## 2023-11-05 NOTE — Progress Notes (Signed)
 11/05/2023 9:07 AM -----------------------------------------------------------[facility]--------------------------------------------------- D(Data) A(Action) R(response)     Data: Discharge Readiness Assessment EDD today 11/05/2023    Action: Chart reviewed    Response: No immediate Barriers to discharge identified at this time.     Daija Routson, RN [facility] [employer] [facility]

## 2024-02-17 ENCOUNTER — Other Ambulatory Visit (HOSPITAL_COMMUNITY): Payer: Self-pay | Admitting: Neurological Surgery

## 2024-02-17 DIAGNOSIS — M5412 Radiculopathy, cervical region: Secondary | ICD-10-CM

## 2024-03-02 ENCOUNTER — Ambulatory Visit

## 2024-03-03 ENCOUNTER — Ambulatory Visit (HOSPITAL_COMMUNITY)
